# Patient Record
Sex: Female | Born: 1955 | Race: White | Hispanic: No | Marital: Married | Smoking: Current every day smoker
Health system: Southern US, Community
[De-identification: ages and names within clinical notes are randomized; demographics above are authoritative.]

## PROBLEM LIST (undated history)

## (undated) DIAGNOSIS — E119 Type 2 diabetes mellitus without complications: Secondary | ICD-10-CM

## (undated) DIAGNOSIS — Z8719 Personal history of other diseases of the digestive system: Secondary | ICD-10-CM

## (undated) DIAGNOSIS — M1990 Unspecified osteoarthritis, unspecified site: Secondary | ICD-10-CM

## (undated) DIAGNOSIS — K219 Gastro-esophageal reflux disease without esophagitis: Secondary | ICD-10-CM

## (undated) DIAGNOSIS — J449 Chronic obstructive pulmonary disease, unspecified: Secondary | ICD-10-CM

## (undated) DIAGNOSIS — C801 Malignant (primary) neoplasm, unspecified: Secondary | ICD-10-CM

## (undated) DIAGNOSIS — E785 Hyperlipidemia, unspecified: Secondary | ICD-10-CM

## (undated) HISTORY — PX: APPENDECTOMY: SHX54

## (undated) HISTORY — PX: TONSILLECTOMY: SUR1361

## (undated) HISTORY — PX: SKIN CANCER EXCISION: SHX779

## (undated) HISTORY — PX: ROTATOR CUFF REPAIR: SHX139

## (undated) HISTORY — PX: TRANSTHORACIC ECHOCARDIOGRAM: SHX275

---

## 1982-03-25 HISTORY — PX: ABDOMINAL HYSTERECTOMY: SHX81

## 2011-11-29 HISTORY — PX: CARDIOVASCULAR STRESS TEST: SHX262

## 2013-03-10 ENCOUNTER — Other Ambulatory Visit: Payer: Self-pay | Admitting: Orthopedic Surgery

## 2013-03-23 ENCOUNTER — Encounter (HOSPITAL_COMMUNITY): Payer: Self-pay | Admitting: Pharmacy Technician

## 2013-03-24 NOTE — Pre-Procedure Instructions (Signed)
Brendaly Townsel  03/24/2013   Your procedure is scheduled on:  Thursday, April 01, 2013 at 7:30 AM.   Report to [facility] Entrance "A" Admitting Office at 5:30 AM.   Call this number if you have problems the morning of surgery: [phone]   Remember:   Do not eat food or drink liquids after midnight Wednesday, 03/31/13.   Take these medicines the morning of surgery with A SIP OF WATER: Premarin, Symbicort inhaler   Do not wear jewelry, make-up or nail polish.  Do not wear lotions, powders, or perfumes. You may wear deodorant.  Do not shave 48 hours prior to surgery.   Do not bring valuables to the hospital.  [facility] is not responsible                  for any belongings or valuables.               Contacts, dentures or bridgework may not be worn into surgery.  Leave suitcase in the car. After surgery it may be brought to your room.  For patients admitted to the hospital, discharge time is determined by your                treatment team.          Special Instructions: Shower using CHG 2 nights before surgery and the night before surgery.  If you shower the day of surgery use CHG.  Use special wash - you have one bottle of CHG for all showers.  You should use approximately 1/3 of the bottle for each shower.   Please read over the following fact sheets that you were given: Pain Booklet, Coughing and Deep Breathing, Blood Transfusion Information and Surgical Site Infection Prevention

## 2013-03-26 ENCOUNTER — Encounter (HOSPITAL_COMMUNITY)
Admission: RE | Admit: 2013-03-26 | Discharge: 2013-03-26 | Disposition: A | Discharge status: Home / Self Care | Payer: Medicare Other | Source: Ambulatory Visit | Attending: Orthopedic Surgery | Admitting: Orthopedic Surgery

## 2013-03-26 ENCOUNTER — Encounter (HOSPITAL_COMMUNITY): Admission: RE | Admit: 2013-03-26 | Payer: Medicare Other | Source: Ambulatory Visit

## 2013-03-26 ENCOUNTER — Encounter (HOSPITAL_COMMUNITY): Payer: Self-pay

## 2013-03-26 DIAGNOSIS — Z01818 Encounter for other preprocedural examination: Secondary | ICD-10-CM | POA: Insufficient documentation

## 2013-03-26 DIAGNOSIS — Z01812 Encounter for preprocedural laboratory examination: Secondary | ICD-10-CM | POA: Insufficient documentation

## 2013-03-26 HISTORY — DX: Hyperlipidemia, unspecified: E78.5

## 2013-03-26 HISTORY — DX: Chronic obstructive pulmonary disease, unspecified: J44.9

## 2013-03-26 HISTORY — DX: Unspecified osteoarthritis, unspecified site: M19.90

## 2013-03-26 HISTORY — DX: Gastro-esophageal reflux disease without esophagitis: K21.9

## 2013-03-26 HISTORY — DX: Personal history of other diseases of the digestive system: Z87.19

## 2013-03-26 HISTORY — DX: Type 2 diabetes mellitus without complications: E11.9

## 2013-03-26 HISTORY — DX: Malignant (primary) neoplasm, unspecified: C80.1

## 2013-03-26 LAB — CBC WITH DIFFERENTIAL/PLATELET
BASOS ABS: 0 10*3/uL (ref 0.0–0.1)
BASOS PCT: 0 % (ref 0–1)
EOS PCT: 1 % (ref 0–5)
Eosinophils Absolute: 0.1 10*3/uL (ref 0.0–0.7)
HCT: 42.5 % (ref 36.0–46.0)
Hemoglobin: 14.3 g/dL (ref 12.0–15.0)
Lymphocytes Relative: 27 % (ref 12–46)
Lymphs Abs: 2.6 10*3/uL (ref 0.7–4.0)
MCH: 30.8 pg (ref 26.0–34.0)
MCHC: 33.6 g/dL (ref 30.0–36.0)
MCV: 91.4 fL (ref 78.0–100.0)
Monocytes Absolute: 0.9 10*3/uL (ref 0.1–1.0)
Monocytes Relative: 10 % (ref 3–12)
Neutro Abs: 6 10*3/uL (ref 1.7–7.7)
Neutrophils Relative %: 62 % (ref 43–77)
PLATELETS: 264 10*3/uL (ref 150–400)
RBC: 4.65 MIL/uL (ref 3.87–5.11)
RDW: 13.8 % (ref 11.5–15.5)
WBC: 9.7 10*3/uL (ref 4.0–10.5)

## 2013-03-26 LAB — COMPREHENSIVE METABOLIC PANEL
ALBUMIN: 4.2 g/dL (ref 3.5–5.2)
ALT: 13 U/L (ref 0–35)
AST: 20 U/L (ref 0–37)
Alkaline Phosphatase: 110 U/L (ref 39–117)
BUN: 8 mg/dL (ref 6–23)
CALCIUM: 9.3 mg/dL (ref 8.4–10.5)
CO2: 26 meq/L (ref 19–32)
Chloride: 100 mEq/L (ref 96–112)
Creatinine, Ser: 0.7 mg/dL (ref 0.50–1.10)
GFR calc Af Amer: >90 mL/min (ref >90)
GFR calc non Af Amer: >90 mL/min (ref >90)
Glucose, Bld: 113 mg/dL — ABNORMAL HIGH (ref 70–99)
Potassium: 4.5 mEq/L (ref 3.7–5.3)
SODIUM: 141 meq/L (ref 137–147)
Total Bilirubin: 0.5 mg/dL (ref 0.3–1.2)
Total Protein: 7.5 g/dL (ref 6.0–8.3)

## 2013-03-26 LAB — ABO/RH: ABO/RH(D): O POS

## 2013-03-26 LAB — TYPE AND SCREEN
ABO/RH(D): O POS
Antibody Screen: NEGATIVE

## 2013-03-26 LAB — URINALYSIS, ROUTINE W REFLEX MICROSCOPIC
Bilirubin Urine: NEGATIVE
Glucose, UA: NEGATIVE mg/dL
Hgb urine dipstick: NEGATIVE
KETONES UR: NEGATIVE mg/dL
Leukocytes, UA: NEGATIVE
NITRITE: NEGATIVE
PROTEIN: NEGATIVE mg/dL
Specific Gravity, Urine: 1.012 (ref 1.005–1.030)
UROBILINOGEN UA: 0.2 mg/dL (ref 0.0–1.0)
pH: 6 (ref 5.0–8.0)

## 2013-03-26 LAB — PROTIME-INR
INR: 0.97 (ref 0.00–1.49)
Prothrombin Time: 12.7 seconds (ref 11.6–15.2)

## 2013-03-26 LAB — APTT: aPTT: 32 seconds (ref 24–37)

## 2013-03-26 LAB — SEDIMENTATION RATE: Sed Rate: 10 mm/hr (ref 0–22)

## 2013-03-26 LAB — C-REACTIVE PROTEIN

## 2013-03-29 ENCOUNTER — Encounter (HOSPITAL_COMMUNITY): Payer: Self-pay

## 2013-03-31 MED ORDER — CEFAZOLIN SODIUM-DEXTROSE 2-3 GM-% IV SOLR
2.0000 g | INTRAVENOUS | Status: AC
  Administered 2013-04-01: 2 g via INTRAVENOUS
  Filled 2013-03-31: qty 50

## 2013-04-01 ENCOUNTER — Inpatient Hospital Stay (HOSPITAL_COMMUNITY): Payer: Medicare Other | Admitting: Anesthesiology

## 2013-04-01 ENCOUNTER — Encounter (HOSPITAL_COMMUNITY)
Admission: RE | Disposition: A | Discharge status: Home / Self Care | Payer: Self-pay | Source: Ambulatory Visit | Attending: Orthopedic Surgery

## 2013-04-01 ENCOUNTER — Inpatient Hospital Stay (HOSPITAL_COMMUNITY)
Admission: RE | Admit: 2013-04-01 | Discharge: 2013-04-02 | DRG: 483 | Disposition: A | Discharge status: Home / Self Care | Payer: Medicare Other | Source: Ambulatory Visit | Attending: Orthopedic Surgery | Admitting: Orthopedic Surgery

## 2013-04-01 ENCOUNTER — Inpatient Hospital Stay (HOSPITAL_COMMUNITY): Payer: Medicare Other

## 2013-04-01 ENCOUNTER — Encounter (HOSPITAL_COMMUNITY): Payer: Medicare Other | Admitting: Vascular Surgery

## 2013-04-01 ENCOUNTER — Encounter (HOSPITAL_COMMUNITY): Payer: Self-pay | Admitting: Surgery

## 2013-04-01 DIAGNOSIS — M19019 Primary osteoarthritis, unspecified shoulder: Secondary | ICD-10-CM | POA: Diagnosis present

## 2013-04-01 DIAGNOSIS — J449 Chronic obstructive pulmonary disease, unspecified: Secondary | ICD-10-CM | POA: Diagnosis present

## 2013-04-01 DIAGNOSIS — F17200 Nicotine dependence, unspecified, uncomplicated: Secondary | ICD-10-CM | POA: Diagnosis present

## 2013-04-01 DIAGNOSIS — E785 Hyperlipidemia, unspecified: Secondary | ICD-10-CM | POA: Diagnosis present

## 2013-04-01 DIAGNOSIS — K219 Gastro-esophageal reflux disease without esophagitis: Secondary | ICD-10-CM | POA: Diagnosis present

## 2013-04-01 DIAGNOSIS — Z85828 Personal history of other malignant neoplasm of skin: Secondary | ICD-10-CM

## 2013-04-01 DIAGNOSIS — E119 Type 2 diabetes mellitus without complications: Secondary | ICD-10-CM | POA: Diagnosis present

## 2013-04-01 DIAGNOSIS — Z79899 Other long term (current) drug therapy: Secondary | ICD-10-CM

## 2013-04-01 DIAGNOSIS — J4489 Other specified chronic obstructive pulmonary disease: Secondary | ICD-10-CM | POA: Diagnosis present

## 2013-04-01 HISTORY — PX: REVERSE SHOULDER ARTHROPLASTY: SHX5054

## 2013-04-01 LAB — GLUCOSE, CAPILLARY
GLUCOSE-CAPILLARY: 99 mg/dL (ref 70–99)
Glucose-Capillary: 94 mg/dL (ref 70–99)
Glucose-Capillary: 99 mg/dL (ref 70–99)
Glucose-Capillary: 91 mg/dL (ref 70–99)

## 2013-04-01 SURGERY — ARTHROPLASTY, SHOULDER, TOTAL, REVERSE
Anesthesia: Regional | Site: Shoulder | Laterality: Right

## 2013-04-01 MED ORDER — OXYCODONE HCL 5 MG PO TABS
5.0000 mg | ORAL_TABLET | ORAL | Status: DC | PRN
  Administered 2013-04-01: 5 mg via ORAL
  Administered 2013-04-01: 10 mg via ORAL
  Filled 2013-04-01: qty 1
  Filled 2013-04-01: qty 2

## 2013-04-01 MED ORDER — OXYCODONE-ACETAMINOPHEN 5-325 MG PO TABS: 1.0000 | ORAL_TABLET | ORAL | Status: DC | PRN

## 2013-04-01 MED ORDER — BUDESONIDE-FORMOTEROL FUMARATE 80-4.5 MCG/ACT IN AERO
2.0000 | INHALATION_SPRAY | Freq: Two times a day (BID) | RESPIRATORY_TRACT | Status: DC
  Administered 2013-04-01 – 2013-04-02 (×2): 2 via RESPIRATORY_TRACT
  Filled 2013-04-01: qty 6.9

## 2013-04-01 MED ORDER — ROSUVASTATIN CALCIUM 5 MG PO TABS
5.0000 mg | ORAL_TABLET | Freq: Every day | ORAL | Status: DC
  Filled 2013-04-01: qty 1

## 2013-04-01 MED ORDER — HYDROCODONE-ACETAMINOPHEN 7.5-325 MG/15ML PO SOLN
10.0000 mL | ORAL | Status: DC | PRN
  Administered 2013-04-01 – 2013-04-02 (×5): 10 mL via ORAL
  Filled 2013-04-01 (×5): qty 15

## 2013-04-01 MED ORDER — DIPHENHYDRAMINE HCL 12.5 MG/5ML PO ELIX: 12.5000 mg | ORAL_SOLUTION | ORAL | Status: DC | PRN

## 2013-04-01 MED ORDER — FENTANYL CITRATE 0.05 MG/ML IJ SOLN
INTRAMUSCULAR | Status: DC | PRN
  Administered 2013-04-01 (×2): 100 ug via INTRAVENOUS
  Administered 2013-04-01: 50 ug via INTRAVENOUS
  Administered 2013-04-01: 25 ug via INTRAVENOUS

## 2013-04-01 MED ORDER — PANTOPRAZOLE SODIUM 40 MG PO TBEC: 80.0000 mg | DELAYED_RELEASE_TABLET | Freq: Every day | ORAL | Status: DC

## 2013-04-01 MED ORDER — ONDANSETRON HCL 4 MG/2ML IJ SOLN
INTRAMUSCULAR | Status: DC | PRN
  Administered 2013-04-01: 4 mg via INTRAVENOUS

## 2013-04-01 MED ORDER — ONDANSETRON HCL 4 MG PO TABS: 4.0000 mg | ORAL_TABLET | Freq: Four times a day (QID) | ORAL | Status: DC | PRN

## 2013-04-01 MED ORDER — ACETAMINOPHEN 325 MG PO TABS: 650.0000 mg | ORAL_TABLET | Freq: Four times a day (QID) | ORAL | Status: DC | PRN

## 2013-04-01 MED ORDER — PROPOFOL 10 MG/ML IV BOLUS
INTRAVENOUS | Status: DC | PRN
  Administered 2013-04-01: 120 mg via INTRAVENOUS

## 2013-04-01 MED ORDER — ALBUTEROL SULFATE HFA 108 (90 BASE) MCG/ACT IN AERS: 2.0000 | INHALATION_SPRAY | Freq: Four times a day (QID) | RESPIRATORY_TRACT | Status: DC | PRN

## 2013-04-01 MED ORDER — ATORVASTATIN CALCIUM 10 MG PO TABS
10.0000 mg | ORAL_TABLET | Freq: Every day | ORAL | Status: DC
  Filled 2013-04-01: qty 1

## 2013-04-01 MED ORDER — CEFAZOLIN SODIUM-DEXTROSE 2-3 GM-% IV SOLR: 2.0000 g | INTRAVENOUS | Status: DC

## 2013-04-01 MED ORDER — ONDANSETRON HCL 4 MG/2ML IJ SOLN
4.0000 mg | Freq: Four times a day (QID) | INTRAMUSCULAR | Status: DC | PRN
  Administered 2013-04-01 – 2013-04-02 (×2): 4 mg via INTRAVENOUS
  Filled 2013-04-01 (×2): qty 2

## 2013-04-01 MED ORDER — HYDROMORPHONE HCL PF 1 MG/ML IJ SOLN
INTRAMUSCULAR | Status: AC
  Filled 2013-04-01: qty 1

## 2013-04-01 MED ORDER — SODIUM CHLORIDE 0.9 % IV SOLN
10.0000 mg | INTRAVENOUS | Status: DC | PRN
  Administered 2013-04-01: 10 ug/min via INTRAVENOUS

## 2013-04-01 MED ORDER — ASPIRIN EC 325 MG PO TBEC
325.0000 mg | DELAYED_RELEASE_TABLET | Freq: Two times a day (BID) | ORAL | Status: DC
  Administered 2013-04-01 – 2013-04-02 (×2): 325 mg via ORAL
  Filled 2013-04-01 (×5): qty 1

## 2013-04-01 MED ORDER — METOCLOPRAMIDE HCL 5 MG PO TABS
5.0000 mg | ORAL_TABLET | Freq: Three times a day (TID) | ORAL | Status: DC | PRN
Start: 2013-04-01 — End: 2013-04-02
  Filled 2013-04-01: qty 2

## 2013-04-01 MED ORDER — NEOSTIGMINE METHYLSULFATE 1 MG/ML IJ SOLN
INTRAMUSCULAR | Status: DC | PRN
  Administered 2013-04-01: 3 mg via INTRAVENOUS

## 2013-04-01 MED ORDER — LACTATED RINGERS IV SOLN
INTRAVENOUS | Status: DC | PRN
  Administered 2013-04-01 (×2): via INTRAVENOUS

## 2013-04-01 MED ORDER — ACETAMINOPHEN 325 MG PO TABS: 325.0000 mg | ORAL_TABLET | ORAL | Status: DC | PRN

## 2013-04-01 MED ORDER — ESTROGENS CONJUGATED 0.3 MG PO TABS
0.3000 mg | ORAL_TABLET | Freq: Every day | ORAL | Status: DC
  Administered 2013-04-01: 0.3 mg via ORAL
  Filled 2013-04-01 (×2): qty 1

## 2013-04-01 MED ORDER — ACETAMINOPHEN 160 MG/5ML PO SOLN
325.0000 mg | ORAL | Status: DC | PRN
  Filled 2013-04-01: qty 20.3

## 2013-04-01 MED ORDER — ALUMINUM HYDROXIDE GEL 320 MG/5ML PO SUSP
15.0000 mL | ORAL | Status: DC | PRN
  Filled 2013-04-01: qty 30

## 2013-04-01 MED ORDER — FLEET ENEMA 7-19 GM/118ML RE ENEM: 1.0000 | ENEMA | Freq: Once | RECTAL | Status: AC | PRN

## 2013-04-01 MED ORDER — ZOLPIDEM TARTRATE 5 MG PO TABS: 5.0000 mg | ORAL_TABLET | Freq: Every evening | ORAL | Status: DC | PRN

## 2013-04-01 MED ORDER — PHENOL 1.4 % MT LIQD: 1.0000 | OROMUCOSAL | Status: DC | PRN

## 2013-04-01 MED ORDER — BISACODYL 10 MG RE SUPP: 10.0000 mg | Freq: Every day | RECTAL | Status: DC | PRN

## 2013-04-01 MED ORDER — HEMOSTATIC AGENTS (NO CHARGE) OPTIME
TOPICAL | Status: DC | PRN
  Administered 2013-04-01: 1 via TOPICAL

## 2013-04-01 MED ORDER — DOCUSATE SODIUM 100 MG PO CAPS
100.0000 mg | ORAL_CAPSULE | Freq: Two times a day (BID) | ORAL | Status: DC
  Administered 2013-04-01 – 2013-04-02 (×2): 100 mg via ORAL
  Filled 2013-04-01 (×3): qty 1

## 2013-04-01 MED ORDER — POVIDONE-IODINE 7.5 % EX SOLN
Freq: Once | CUTANEOUS | Status: DC
  Filled 2013-04-01: qty 118

## 2013-04-01 MED ORDER — ALBUTEROL SULFATE (2.5 MG/3ML) 0.083% IN NEBU: 2.5000 mg | INHALATION_SOLUTION | Freq: Four times a day (QID) | RESPIRATORY_TRACT | Status: DC | PRN

## 2013-04-01 MED ORDER — ROCURONIUM BROMIDE 100 MG/10ML IV SOLN
INTRAVENOUS | Status: DC | PRN
  Administered 2013-04-01: 10 mg via INTRAVENOUS
  Administered 2013-04-01: 30 mg via INTRAVENOUS

## 2013-04-01 MED ORDER — HYDROCODONE-ACETAMINOPHEN 5-325 MG PO TABS: 1.0000 | ORAL_TABLET | ORAL | Status: DC | PRN

## 2013-04-01 MED ORDER — MENTHOL 3 MG MT LOZG
1.0000 | LOZENGE | OROMUCOSAL | Status: DC | PRN
Start: 2013-04-01 — End: 2013-04-02

## 2013-04-01 MED ORDER — MORPHINE SULFATE 2 MG/ML IJ SOLN
1.0000 mg | INTRAMUSCULAR | Status: DC | PRN
  Administered 2013-04-01 (×2): 1 mg via INTRAVENOUS
  Filled 2013-04-01 (×2): qty 1

## 2013-04-01 MED ORDER — GLYCOPYRROLATE 0.2 MG/ML IJ SOLN
INTRAMUSCULAR | Status: DC | PRN
  Administered 2013-04-01: .5 mg via INTRAVENOUS

## 2013-04-01 MED ORDER — ACETAMINOPHEN 650 MG RE SUPP: 650.0000 mg | Freq: Four times a day (QID) | RECTAL | Status: DC | PRN

## 2013-04-01 MED ORDER — MIDAZOLAM HCL 5 MG/5ML IJ SOLN
INTRAMUSCULAR | Status: DC | PRN
  Administered 2013-04-01 (×2): 1 mg via INTRAVENOUS

## 2013-04-01 MED ORDER — CEFAZOLIN SODIUM 1-5 GM-% IV SOLN
1.0000 g | Freq: Four times a day (QID) | INTRAVENOUS | Status: AC
  Administered 2013-04-01 – 2013-04-02 (×2): 1 g via INTRAVENOUS
  Filled 2013-04-01 (×3): qty 50

## 2013-04-01 MED ORDER — SODIUM CHLORIDE 0.9 % IV SOLN
INTRAVENOUS | Status: DC
  Administered 2013-04-02: 01:00:00 via INTRAVENOUS

## 2013-04-01 MED ORDER — DOCUSATE SODIUM 100 MG PO CAPS: 100.0000 mg | ORAL_CAPSULE | Freq: Three times a day (TID) | ORAL | Status: DC | PRN

## 2013-04-01 MED ORDER — SENNOSIDES-DOCUSATE SODIUM 8.6-50 MG PO TABS
1.0000 | ORAL_TABLET | Freq: Every evening | ORAL | Status: DC | PRN
Start: 2013-04-01 — End: 2013-04-02

## 2013-04-01 MED ORDER — LIDOCAINE HCL (CARDIAC) 20 MG/ML IV SOLN
INTRAVENOUS | Status: DC | PRN
  Administered 2013-04-01: 60 mg via INTRAVENOUS

## 2013-04-01 MED ORDER — ONDANSETRON HCL 4 MG/2ML IJ SOLN: 4.0000 mg | Freq: Once | INTRAMUSCULAR | Status: DC | PRN

## 2013-04-01 MED ORDER — SODIUM CHLORIDE 0.9 % IR SOLN
Status: DC | PRN
  Administered 2013-04-01: 1000 mL

## 2013-04-01 MED ORDER — METOCLOPRAMIDE HCL 5 MG/ML IJ SOLN
5.0000 mg | Freq: Three times a day (TID) | INTRAMUSCULAR | Status: DC | PRN
Start: 2013-04-01 — End: 2013-04-02

## 2013-04-01 MED ORDER — HYDROMORPHONE HCL PF 1 MG/ML IJ SOLN
0.2500 mg | INTRAMUSCULAR | Status: DC | PRN
  Administered 2013-04-01 (×2): 0.5 mg via INTRAVENOUS

## 2013-04-01 MED ORDER — ROPIVACAINE HCL 5 MG/ML IJ SOLN
INTRAMUSCULAR | Status: DC | PRN
  Administered 2013-04-01: 20 mL via PERINEURAL

## 2013-04-01 SURGICAL SUPPLY — 84 items
BIT DRILL 5/64X5 DISP (BIT) IMPLANT
BLADE SAW SAG 73X25 THK (BLADE) ×1
BLADE SAW SGTL 73X25 THK (BLADE) ×2 IMPLANT
BLADE SURG 15 STRL LF DISP TIS (BLADE) ×2 IMPLANT
BLADE SURG 15 STRL SS (BLADE) ×1
BOWL SMART MIX CTS (DISPOSABLE) IMPLANT
CAPT SHOULD DELTAXTEND CEM MOD ×3 IMPLANT
CEMENT BONE DEPUY (Cement) ×3 IMPLANT
CHLORAPREP W/TINT 26ML (MISCELLANEOUS) ×3 IMPLANT
CLOTH BEACON ORANGE TIMEOUT ST (SAFETY) IMPLANT
COVER SURGICAL LIGHT HANDLE (MISCELLANEOUS) ×3 IMPLANT
DRAPE INCISE IOBAN 66X45 STRL (DRAPES) ×9 IMPLANT
DRAPE SURG 17X23 STRL (DRAPES) ×3 IMPLANT
DRAPE TABLE COVER HEAVY DUTY (DRAPES) ×3 IMPLANT
DRAPE U-SHAPE 47X51 STRL (DRAPES) ×3 IMPLANT
DRSG ADAPTIC 3X8 NADH LF (GAUZE/BANDAGES/DRESSINGS) IMPLANT
DRSG MEPILEX BORDER 4X4 (GAUZE/BANDAGES/DRESSINGS) ×3 IMPLANT
DRSG MEPILEX BORDER 4X8 (GAUZE/BANDAGES/DRESSINGS) ×3 IMPLANT
DRSG PAD ABDOMINAL 8X10 ST (GAUZE/BANDAGES/DRESSINGS) IMPLANT
ELECT BLADE 4.0 EZ CLEAN MEGAD (MISCELLANEOUS) ×3
ELECT REM PT RETURN 9FT ADLT (ELECTROSURGICAL) ×3
ELECTRODE BLDE 4.0 EZ CLN MEGD (MISCELLANEOUS) ×2 IMPLANT
ELECTRODE REM PT RTRN 9FT ADLT (ELECTROSURGICAL) ×2 IMPLANT
EVACUATOR 1/8 PVC DRAIN (DRAIN) ×3 IMPLANT
GLOVE BIO SURGEON STRL SZ7 (GLOVE) ×3 IMPLANT
GLOVE BIO SURGEON STRL SZ7.5 (GLOVE) ×3 IMPLANT
GLOVE BIOGEL PI IND STRL 6.5 (GLOVE) ×2 IMPLANT
GLOVE BIOGEL PI IND STRL 7.0 (GLOVE) ×8 IMPLANT
GLOVE BIOGEL PI IND STRL 7.5 (GLOVE) ×2 IMPLANT
GLOVE BIOGEL PI IND STRL 8 (GLOVE) ×2 IMPLANT
GLOVE BIOGEL PI INDICATOR 6.5 (GLOVE) ×1
GLOVE BIOGEL PI INDICATOR 7.0 (GLOVE) ×4
GLOVE BIOGEL PI INDICATOR 7.5 (GLOVE) ×1
GLOVE BIOGEL PI INDICATOR 8 (GLOVE) ×1
GLOVE ECLIPSE 6.5 STRL STRAW (GLOVE) ×6 IMPLANT
GLOVE SURG ORTHO 7.0 STRL STRW (GLOVE) ×3 IMPLANT
GLOVE SURG SS PI 6.5 STRL IVOR (GLOVE) ×3 IMPLANT
GOWN PREVENTION PLUS LG XLONG (DISPOSABLE) IMPLANT
GOWN STRL REIN XL XLG (GOWN DISPOSABLE) IMPLANT
GOWN STRL REUS W/ TWL LRG LVL3 (GOWN DISPOSABLE) ×8 IMPLANT
GOWN STRL REUS W/ TWL XL LVL3 (GOWN DISPOSABLE) ×2 IMPLANT
GOWN STRL REUS W/TWL LRG LVL3 (GOWN DISPOSABLE) ×4
GOWN STRL REUS W/TWL XL LVL3 (GOWN DISPOSABLE) ×1
HANDPIECE INTERPULSE COAX TIP (DISPOSABLE) ×1
HEMOSTAT SURGICEL 2X14 (HEMOSTASIS) ×3 IMPLANT
HOOD PEEL AWAY FACE SHEILD DIS (HOOD) ×9 IMPLANT
KIT BASIN OR (CUSTOM PROCEDURE TRAY) ×3 IMPLANT
KIT ROOM TURNOVER OR (KITS) ×3 IMPLANT
MANIFOLD NEPTUNE II (INSTRUMENTS) ×3 IMPLANT
NEEDLE HYPO 25GX1X1/2 BEV (NEEDLE) IMPLANT
NEEDLE MAYO TROCAR (NEEDLE) ×3 IMPLANT
NS IRRIG 1000ML POUR BTL (IV SOLUTION) ×3 IMPLANT
PACK SHOULDER (CUSTOM PROCEDURE TRAY) ×3 IMPLANT
PAD ARMBOARD 7.5X6 YLW CONV (MISCELLANEOUS) ×6 IMPLANT
PIN METAGLENE 2.5 (PIN) ×3 IMPLANT
RETRIEVER SUT HEWSON (MISCELLANEOUS) ×3 IMPLANT
SET HNDPC FAN SPRY TIP SCT (DISPOSABLE) ×2 IMPLANT
SLING ARM IMMOBILIZER LRG (SOFTGOODS) IMPLANT
SLING ARM IMMOBILIZER MED (SOFTGOODS) ×3 IMPLANT
SMARTMIX MINI TOWER (MISCELLANEOUS) ×3
SPONGE GAUZE 4X4 12PLY (GAUZE/BANDAGES/DRESSINGS) IMPLANT
SPONGE LAP 18X18 X RAY DECT (DISPOSABLE) ×3 IMPLANT
SPONGE LAP 4X18 X RAY DECT (DISPOSABLE) ×3 IMPLANT
STRIP CLOSURE SKIN 1/2X4 (GAUZE/BANDAGES/DRESSINGS) ×3 IMPLANT
SUCTION FRAZIER TIP 10 FR DISP (SUCTIONS) ×3 IMPLANT
SUPPORT WRAP ARM LG (MISCELLANEOUS) ×3 IMPLANT
SUT ETHIBOND 2 OS 4 DA (SUTURE) IMPLANT
SUT ETHIBOND NAB CT1 #1 30IN (SUTURE) ×3 IMPLANT
SUT FIBERWIRE #2 38 T-5 BLUE (SUTURE) ×9
SUT MNCRL AB 4-0 PS2 18 (SUTURE) ×3 IMPLANT
SUT SILK 2 0 TIES 17X18 (SUTURE)
SUT SILK 2-0 18XBRD TIE BLK (SUTURE) IMPLANT
SUT VIC AB 0 CTB1 27 (SUTURE) ×3 IMPLANT
SUT VIC AB 2-0 CT1 27 (SUTURE) ×1
SUT VIC AB 2-0 CT1 TAPERPNT 27 (SUTURE) ×2 IMPLANT
SUTURE FIBERWR #2 38 T-5 BLUE (SUTURE) ×6 IMPLANT
SWAB COLLECTION DEVICE MRSA (MISCELLANEOUS) ×3 IMPLANT
SYR CONTROL 10ML LL (SYRINGE) IMPLANT
TOWEL OR 17X24 6PK STRL BLUE (TOWEL DISPOSABLE) ×6 IMPLANT
TOWEL OR 17X26 10 PK STRL BLUE (TOWEL DISPOSABLE) ×3 IMPLANT
TOWER SMARTMIX MINI (MISCELLANEOUS) ×2 IMPLANT
TRAY FOLEY CATH 16FRSI W/METER (SET/KITS/TRAYS/PACK) IMPLANT
TUBE ANAEROBIC SPECIMEN COL (MISCELLANEOUS) ×3 IMPLANT
WATER STERILE IRR 1000ML POUR (IV SOLUTION) IMPLANT

## 2013-04-01 NOTE — Discharge Instructions (Signed)
Discharge Instructions after Reverse Total Shoulder Arthroplasty ° ° °A sling has been provided for you. You are to where this at all times, even while sleeping, until your first post operative visit with Dr. Chandler. °Use ice on the shoulder intermittently over the first 48 hours after surgery.  °Pain medicine has been prescribed for you.  °Use your medicine liberally over the first 48 hours, and then you can begin to taper your use. You may take Extra Strength Tylenol or Tylenol only in place of the pain pills. DO NOT take ANY nonsteroidal anti-inflammatory pain medications: Advil, Motrin, Ibuprofen, Aleve, Naproxen or Naprosyn.  °Take one aspirin a day for 2 weeks after surgery, unless you have an aspirin sensitivity/allergy or asthma.  °You may remove your dressing after two days  °You may shower 5 days after surgery. The incisions CANNOT get wet prior to 5 days. Simply allow the water to wash over the site and then pat dry. Do not rub the incisions. Make sure your axilla (armpit) is completely dry after showering. ° ° ° °Please call [phone] during normal business hours or [phone] after hours for any problems. Including the following: ° °- excessive redness of the incisions °- drainage for more than 4 days °- fever of more than 101.5 F ° °*Please note that pain medications will not be refilled after hours or on weekends. ° ° ° ° °

## 2013-04-01 NOTE — H&P (Signed)
Michelle Perez is an 59 y.o. female.   Chief Complaint: Chronic right shoulder HPI: Chronic severe right shoulder pain that limits her daily activities and her sleep. She has had multiple previous surgeries and is left with painful dysfunctional shoulder. She has failed other treatments with activity modification, injection treatment, physical therapy and oral medications.  Past Medical History  Diagnosis Date  . Hyperlipemia   . Diabetes mellitus without complication     type 2 , diet and exercise controlled  . COPD (chronic obstructive pulmonary disease)   . GERD (gastroesophageal reflux disease)   . H/O hiatal hernia   . Arthritis   . Cancer     skin cancer - nose    Past Surgical History  Procedure Laterality Date  . Skin cancer excision      nose and back  . Abdominal hysterectomy  1984  . Tonsillectomy      and adenoids age 32  . Rotator cuff repair Right     x3  . Cardiovascular stress test  11/29/11    poor image quality, no evidence of ischemia in well visualized segments, inferior lateral wall impossible to evaluate secondary to significant GI artifiact, preserved EF 56% [facility])    History reviewed. No pertinent family history. Social History:  reports that she has been smoking.  She does not have any smokeless tobacco history on file. She reports that she does not drink alcohol or use illicit drugs.  Allergies:  Allergies  Allergen Reactions  . Codeine Hives    vicodin is okay  . Tape Rash    Medications Prior to Admission  Medication Sig Dispense Refill  . budesonide-formoterol (SYMBICORT) 80-4.5 MCG/ACT inhaler Inhale 2 puffs into the lungs 2 (two) times daily.      Marland Kitchen esomeprazole (NEXIUM) 40 MG capsule Take 40 mg by mouth daily at 12 noon.      . estrogens, conjugated, (PREMARIN) 0.3 MG tablet Take 0.3 mg by mouth daily. Take daily for 21 days then do not take for 7 days.      . rosuvastatin (CRESTOR) 5 MG tablet Take 5 mg by mouth daily.      Marland Kitchen  albuterol (PROAIR HFA) 108 (90 BASE) MCG/ACT inhaler Inhale into the lungs every 6 (six) hours as needed for wheezing or shortness of breath.        No results found for this or any previous visit (from the past 48 hour(s)). No results found.  Review of Systems  All other systems reviewed and are negative.    Blood pressure 126/56, pulse 74, temperature 97.5 F (36.4 C), temperature source Oral, resp. rate 18, SpO2 96.00%. Physical Exam  Constitutional: She is oriented to person, place, and time. She appears well-developed and well-nourished.  HENT:  Head: Atraumatic.  Eyes: EOM are normal.  Cardiovascular: Intact distal pulses.   Respiratory: Effort normal.  Musculoskeletal:  R shoulder pain with any motion.  Motion limited to 100 deg FF.  Grossly NVID.  Neurological: She is alert and oriented to person, place, and time.  Skin: Skin is warm and dry.  Psychiatric: She has a normal mood and affect.     Assessment/Plan Chronic right shoulder pain status post multiple previous surgeries Plan total shoulder versus reverse total shoulder replacement depending on status rotator cuff Risks / benefits of surgery discussed Consent on chart  NPO for OR Preop antibiotics   Chantell Kunkler WILLIAM 04/01/2013, 7:18 AM

## 2013-04-01 NOTE — Progress Notes (Signed)
During preop assessment patient informed Nurse that she was having a total shoulder replacement on her right shoulder, however the most recent consent form in EPIC stated that patient was having a right shoulder arthroscopy rotator cuff repair. Nurse called Dr. Tamera Punt for clarification on planned procedure and he stated that patient is having a right total shoulder replacement today. Will reenter consent form, inform patient of this, and have her sign consent form.

## 2013-04-01 NOTE — Progress Notes (Signed)
Report given to angel rn as cargiver

## 2013-04-01 NOTE — Progress Notes (Signed)
Utilization review completed.  

## 2013-04-01 NOTE — Anesthesia Procedure Notes (Addendum)
Anesthesia Regional Block:  Interscalene brachial plexus block  Pre-Anesthetic Checklist: ,, timeout performed, Correct Patient, Correct Site, Correct Laterality, Correct Procedure, Correct Position, site marked, Risks and benefits discussed,  Surgical consent,  Pre-op evaluation,  At surgeon's request and post-op pain management  Laterality: Right  Prep: chloraprep       Needles:  Injection technique: Single-shot  Needle Type: Stimiplex          Additional Needles:  Procedures: ultrasound guided (picture in chart) Interscalene brachial plexus block Narrative:  Start time: 04/01/2013 7:15 AM End time: 04/01/2013 7:22 AM Injection made incrementally with aspirations every 5 mL.  Performed by: Personally  Anesthesiologist: [place]:   Narrative:    Procedure Name: Intubation Date/Time: 04/01/2013 8:52 AM Performed by: Octavio Graves Pre-anesthesia Checklist: Patient identified, Timeout performed, Emergency Drugs available, Patient being monitored and Suction available Patient Re-evaluated:Patient Re-evaluated prior to inductionOxygen Delivery Method: Circle system utilized Preoxygenation: Pre-oxygenation with 100% oxygen Intubation Type: IV induction Ventilation: Mask ventilation without difficulty Laryngoscope Size: Miller and 2 Grade View: Grade I Tube type: Oral Tube size: 7.0 mm Number of attempts: 1 Airway Equipment and Method: Stylet Placement Confirmation: ETT inserted through vocal cords under direct vision,  breath sounds checked- equal and bilateral and positive ETCO2 Secured at: 22 cm Tube secured with: Tape Dental Injury: Teeth and Oropharynx as per pre-operative assessment  Comments: IV induction Moser- intubation AM CRNA- atraumatic teeth and mouth as preop

## 2013-04-01 NOTE — Transfer of Care (Signed)
Immediate Anesthesia Transfer of Care Note  Patient: Michelle Perez  Procedure(s) Performed: Procedure(s): REVERSE SHOULDER ARTHROPLASTY (Right)  Patient Location: PACU  Anesthesia Type:General  Level of Consciousness: alert   Airway & Oxygen Therapy: Patient Spontanous Breathing and Patient connected to nasal cannula oxygen  Post-op Assessment: Report given to PACU RN and Post -op Vital signs reviewed and stable  Post vital signs: Reviewed and stable  Complications: No apparent anesthesia complications

## 2013-04-01 NOTE — Anesthesia Preprocedure Evaluation (Addendum)
Anesthesia Evaluation  Patient identified by MRN, date of birth, ID band Patient awake    Reviewed: Allergy & Precautions, H&P , NPO status , Patient's Chart, lab work & pertinent test results  History of Anesthesia Complications Negative for: history of anesthetic complications  Airway Mallampati: I TM Distance: >3 FB Neck ROM: Full    Dental  (+) Upper Dentures, Teeth Intact and Dental Advisory Given   Pulmonary COPD COPD inhaler, Current Smoker,  Smokes 1/2 ppd   Pulmonary exam normal       Cardiovascular Exercise Tolerance: Good negative cardio ROS  Rhythm:Regular Rate:Normal - Systolic murmurs and - Diastolic murmurs    Neuro/Psych negative neurological ROS  negative psych ROS   GI/Hepatic Neg liver ROS, hiatal hernia, GERD-  Medicated and Controlled,  Endo/Other  diabetes, Well Controlled, Type 2  Renal/GU negative Renal ROS  negative genitourinary   Musculoskeletal   Abdominal   Peds  Hematology negative hematology ROS (+)   Anesthesia Other Findings   Reproductive/Obstetrics                          Anesthesia Physical Anesthesia Plan  ASA: II  Anesthesia Plan: General and Regional   Post-op Pain Management:    Induction: Intravenous  Airway Management Planned: Oral ETT and LMA  Additional Equipment: None  Intra-op Plan:   Post-operative Plan: Extubation in OR  Informed Consent: I have reviewed the patients History and Physical, chart, labs and discussed the procedure including the risks, benefits and alternatives for the proposed anesthesia with the patient or authorized representative who has indicated his/her understanding and acceptance.   Dental advisory given  Plan Discussed with: CRNA and Surgeon  Anesthesia Plan Comments:         Anesthesia Quick Evaluation

## 2013-04-01 NOTE — Op Note (Signed)
Procedure(s): REVERSE SHOULDER ARTHROPLASTY Procedure Note  Steward DroneWinona Perez female 58 y.o. 04/01/2013  Procedure(s) and Anesthesia Type:    *RIGHT REVERSE SHOULDER ARTHROPLASTY - General with preoperative interscalene block   Indications:  58 y.o. female  With chronic severe right shoulder pain status post multiple previous right shoulder surgeries. She failed conservative treatment with extensive activity modification, oral medications, injections and physical therapy. Ultimately she want to go forward shoulder replacement in hopes of decreasing her shoulder pain. I felt that given her multiple previous rotator cuff surgeries and abnormal anatomy that a reverse total shoulder replacement would be the most reliable method of relieving her pain. She understood potential risks benefits alternatives to the procedure including but not limited to risk of bleeding infection damage to neurovascular structures, incomplete pain relief, and the potential need for future surgery. She was to go forward with surgery.      Surgeon: Mable ParisHANDLER,Helen Cuff WILLIAM   Assistants: Damita Lackanielle Lalibert PA-C [facility](Danielle was present and scrubbed throughout the procedure and was essential in positioning, retraction, exposure, and closure)  Anesthesia: General endotracheal anesthesia with preoperative interscalene block    Procedure Detail  REVERSE SHOULDER ARTHROPLASTY   Estimated Blood Loss:  150 mL         Drains: 1 medium hemovac  Blood Given: none          Specimens: none        Complications:  * No complications entered in OR log *         Disposition: PACU - hemodynamically stable.         Condition: stable      OPERATIVE FINDINGS:  A DePuy press-fit reverse total shoulder arthroplasty was placed with a  size 10 stem, 1 centered, a 38 glenosphere standard, and a +3-mm poly insert. The base plate  fixation was excellent.  PROCEDURE: The patient was identified in the preoperative holding area  where I  personally marked the operative site after verifying site, side,  and procedure with the patient. An interscalene block given by  the attending anesthesiologist in the holding area and the patient was taken back to the operating room where all extremities were  carefully padded in position after general anesthesia was induced. She  was placed in a beach-chair position and the operative upper extremity was  prepped and draped in a standard sterile fashion. An approximately 10-  cm incision was made from the tip of the coracoid process to the center  point of the humerus at the level of the axilla. Dissection was carried  down through subcutaneous tissues to the level of the cephalic vein  which was taken laterally with the deltoid. The pectoralis major was  retracted medially. The subdeltoid space was developed and the lateral  edge of the conjoined tendon was identified. The undersurface of  conjoined tendon was palpated and the musculocutaneous nerve was not in  the field. Retractor was placed underneath the conjoined and second  retractor was placed lateral into the deltoid. The circumflex humeral  artery and vessels were identified and clamped and coagulated. The  biceps tendon was scarred into the proximal humerus and was.  The subscapularis was taken down in a sheath with.  The  joint was then gently externally rotated while the capsule was released  from the humeral neck around to just beyond the 6 o'clock position. At  this point, the joint was dislocated and the humeral head was presented  into the wound. Rotator cuff was noted to be intact  with no complete tears however at the remaining cuff was very thin and appeared to be of poor quality. The posterior head was deficient. There is no obvious defect on the articular surface. Given the abnormal appearing rotator cuff and atrophy seen in MRI I felt that it could not be reliable for a total shoulder arthroplasty would likely cause  continued pain. Therefore the decision was made to proceed with a reverse total shoulder arthroplasty. The intramedullary canal was then entered with  sequential reamers up to a 10-mm reamer which was felt to be the  appropriate size. The cutting guide was used to make the appropriate  head cut and the head was saved potentially bone grafting.  The glenoid was exposed with the arm in an  abducted extended position. The anterior and posterior labrum were  completely excised and the capsule was released circumferentially to  allow for exposure of the glenoid for preparation. During exposure I was attempting to digitally palpate the axillary nerve on the lower border of the subscapularis and was manually freeing up some scar tissue I encountered some brisk bleeding. I was able to get a retractor beneath the conjoined tendon and visualize a small venous branch that was bleeding briskly. Under direct visualization I used a tonsil to clamp it while Bovie electric cautery was used to coagulate. There was close proximity to neurovascular structures and the arm did show some feedback with electrocautery, however this was done under direct visualization and the clamp was not touching any other structures other than the bleeding vein. The bleeding was successfully stopped.  The guidepin was then placed using the guide in neutral angulation and the reamer was  placed over the guidepin to ream down to concentric surface. Superior  hand reamer was used as well. The central drill hole was then made and  stayed within the glenoid vault. The Metaglene was then impacted with  Excellent press fit. The superior and inferior screws were then  drilled, measured, and filled with appropriate-sized locking screw  alternatively tightening top and bottom to bring the Metaglene down  tightly against the bone. A posterior nonlocking screw was placed, but she did not have enough bone to place a reasonable anterior screw and this  hole was left open.. Fixation was very good.  The humerus was then again exposed and the proximal metaphyseal reamer was used for a size 1 implant. Several suture anchors were removed. The gleno sphere was placed in the metaphysis of the proximal humerus and then reduced to the base plate.  The glenosphere was then impacted over the [facility] taper and tightened down  with the screw. The trial implant was then again placed in the proximal humerus and the poly trials were tested and the above implant was felt to be the most appropriate soft tissue tensioning with excellent stability and  excellent range of motion. Therefore, final humeral stem was placed press fit.  And then the trial polyethylene inserts were tested again and the above implant was felt to be the most appropriate for final insertion. The joint was reduced taken through full range of motion and felt to be stable. Soft tissue tension was appropriate. A medium Hemovac drain was placed out underneath the deltoid prior to closure. The joint was then copiously irrigated with pulse  lavage and the wound was then closed. The subscapularis was repaired using 2 #2 FiberWire soft in a Mason-Allen configuration.  Skin was closed with 2-0 Vicryl in a deep dermal layer and 4-0  Monocryl for skin closure. Steri-Strips were applied.terile  dressings were then applied as well as a sling. The patient was allowed  to awaken from general anesthesia, transferred to stretcher, and taken  to recovery room in stable condition.   POSTOPERATIVE PLAN: The patient will be kept in the hospital postoperatively  for pain control and therapy.

## 2013-04-01 NOTE — Preoperative (Signed)
Beta Blockers   Reason not to administer Beta Blockers:Not Applicable 

## 2013-04-02 LAB — BASIC METABOLIC PANEL
BUN: 6 mg/dL (ref 6–23)
CO2: 27 mEq/L (ref 19–32)
CREATININE: 0.63 mg/dL (ref 0.50–1.10)
Calcium: 8.2 mg/dL — ABNORMAL LOW (ref 8.4–10.5)
Chloride: 100 mEq/L (ref 96–112)
Glucose, Bld: 129 mg/dL — ABNORMAL HIGH (ref 70–99)
Potassium: 4 mEq/L (ref 3.7–5.3)
Sodium: 137 mEq/L (ref 137–147)

## 2013-04-02 LAB — CBC
HEMATOCRIT: 31.2 % — AB (ref 36.0–46.0)
Hemoglobin: 10.7 g/dL — ABNORMAL LOW (ref 12.0–15.0)
MCH: 31.5 pg (ref 26.0–34.0)
MCHC: 34.3 g/dL (ref 30.0–36.0)
MCV: 91.8 fL (ref 78.0–100.0)
PLATELETS: 186 10*3/uL (ref 150–400)
RBC: 3.4 MIL/uL — ABNORMAL LOW (ref 3.87–5.11)
RDW: 13.9 % (ref 11.5–15.5)
WBC: 8.8 10*3/uL (ref 4.0–10.5)

## 2013-04-02 LAB — GLUCOSE, CAPILLARY
Glucose-Capillary: 124 mg/dL — ABNORMAL HIGH (ref 70–99)
Glucose-Capillary: 117 mg/dL — ABNORMAL HIGH (ref 70–99)

## 2013-04-02 NOTE — Anesthesia Postprocedure Evaluation (Signed)
  Anesthesia Post-op Note  Patient: Michelle Perez  Procedure(s) Performed: Procedure(s): REVERSE SHOULDER ARTHROPLASTY (Right)  Patient Location: PACU  Anesthesia Type:General and Regional  Level of Consciousness: awake, alert  and oriented  Airway and Oxygen Therapy: Patient Spontanous Breathing  Post-op Pain: none  Post-op Assessment: Post-op Vital signs reviewed, Patient's Cardiovascular Status Stable, Respiratory Function Stable, Patent Airway and No signs of Nausea or vomiting  Post-op Vital Signs: Reviewed and stable  Complications: No apparent anesthesia complications

## 2013-04-02 NOTE — Evaluation (Signed)
Occupational Therapy Evaluation and Discharge Patient Details Name: Michelle Perez MRN: [id number] DOB: [birth year] Today's Date: 04/02/2013 Time: 8182-9937 OT Time Calculation (min): 24 min  OT Assessment / Plan / Recommendation History of present illness reverse RTSA   Clinical Impression   This 58 yo female admitted and underwent above presents to acute OT with all education completed. Will D/C from acute OT.    OT Assessment  Progress rehab of shoulder as ordered by MD at follow-up appointment    Follow Up Recommendations   (follow up per MD)       Equipment Recommendations  None recommended by OT          Precautions / Restrictions Precautions Precautions: Shoulder Type of Shoulder Precautions: Post op shoulder handout given Shoulder Interventions: Shoulder sling/immobilizer;Off for dressing/bathing/exercises Required Braces or Orthoses: Sling Restrictions Weight Bearing Restrictions: Yes Other Position/Activity Restrictions: NWB        Acute Rehab OT Goals Patient Stated Goal: Home today  Visit Information  Last OT Received On: 04/02/13 Assistance Needed: +1 History of Present Illness: reverse RTSA       Prior Functioning     Home Living Family/patient expects to be discharged to:: Private residence Living Arrangements: Spouse/significant other Available Help at Discharge: Available 24 hours/day;Family Type of Home: House Home Equipment: Hand held shower head Prior Function Level of Independence: Independent Communication Communication: No difficulties Dominant Hand: Right            Cognition  Cognition Arousal/Alertness: Awake/alert Behavior During Therapy: WFL for tasks assessed/performed Overall Cognitive Status: Within Functional Limits for tasks assessed    Extremity/Trunk Assessment Upper Extremity Assessment Upper Extremity Assessment: RUE deficits/detail RUE Deficits / Details: Reverse TSA this admission; elbow is minimally limited  in full extension; wrist, hand WNL RUE Coordination: decreased gross motor     Mobility Bed Mobility Overal bed mobility: Modified Independent Transfers Overall transfer level: Needs assistance Equipment used: 1 person hand held assist Transfers: Sit to/from Stand Sit to Stand: Min guard     Exercise Other Exercises Other Exercises: Pt aware she is to do elbow exercises 5x/day 10 reps each (can do more if she wishes) Donning/doffing shirt without moving shoulder: Caregiver independent with task Method for sponge bathing under operated UE: Caregiver independent with task Donning/doffing sling/immobilizer: Caregiver independent with task Correct positioning of sling/immobilizer: Caregiver independent with task Pendulum exercises (written home exercise program):  (NA) ROM for elbow, wrist and digits of operated UE: Independent Sling wearing schedule (on at all times/off for ADL's): Independent Proper positioning of operated UE when showering: Independent Dressing change:  (NA) Positioning of UE while sleeping: Caregiver independent with task      End of Session OT - End of Session Activity Tolerance:  (limited by nausea when up) Patient left: in bed;with call bell/phone within reach;with family/visitor present;with nursing/sitter in room Nurse Communication:  (pt needs nausea meds)       Almon Register [phone] 04/02/2013, 11:17 AM

## 2013-04-02 NOTE — Progress Notes (Signed)
PATIENT ID: Michelle Perez   1 Day Post-Op Procedure(s) (LRB): REVERSE SHOULDER ARTHROPLASTY (Right)  Subjective: Reports difficulty with pain overnight after block wore off. Pain better controlled now this am. No other complaints or concerns.   Objective:  Filed Vitals:   04/02/13 0544  BP: 132/51  Pulse: 82  Temp: 99.9 F (37.7 C)  Resp: 18     R UE dressing c/d/i Drain removed today  Sling repositioned Wiggles fingers, distally NVI   Labs:   Recent Labs  04/02/13 0550  HGB 10.7*   Recent Labs  04/02/13 0550  WBC 8.8  RBC 3.40*  HCT 31.2*  PLT 186   Recent Labs  04/02/13 0550  NA 137  K 4.0  CL 100  CO2 27  BUN 6  CREATININE 0.63  GLUCOSE 129*  CALCIUM 8.2*    Assessment and Plan: Doing well 1 day s/p right reverse TSA OT to teach hand, wrist, elbow rom only Can d/c today if pain under control with oral pain medications  Percocet for home pain control. Script in chart Fu with Dr. Tamera Punt in two weeks  VTE proph: ASA 325mg  BID, SCDs

## 2013-04-02 NOTE — Progress Notes (Signed)
Pt states Dr ordered wrong pain med Drs office paged.

## 2013-04-02 NOTE — Discharge Summary (Signed)
Patient ID: Michelle Perez MRN: [id number] DOB/AGE: [birth year] 58 y.o.  Admit date: 04/01/2013 Discharge date: 04/02/2013  Admission Diagnoses:  Active Problems:   Shoulder arthritis   Discharge Diagnoses:  Same  Past Medical History  Diagnosis Date  . Hyperlipemia   . Diabetes mellitus without complication     type 2 , diet and exercise controlled  . COPD (chronic obstructive pulmonary disease)   . GERD (gastroesophageal reflux disease)   . H/O hiatal hernia   . Arthritis   . Cancer     skin cancer - nose    Surgeries: Procedure(s): REVERSE SHOULDER ARTHROPLASTY on 04/01/2013   Consultants:    Discharged Condition: Improved  Hospital Course: Michelle Perez is an 58 y.o. female who was admitted 04/01/2013 for operative treatment of shoulder arthritis, rotator cuff tear .Chronic severe right shoulder pain that limits her daily activities and her sleep. She has had multiple previous surgeries and is left with painful dysfunctional shoulder. She has failed other treatments with activity modification, injection treatment, physical therapy and oral medications.   Patient has severe unremitting pain that affects sleep, daily activities, and work/hobbies. After pre-op clearance the patient was taken to the operating room on 04/01/2013 and underwent  Procedure(s): [place].    Patient was given perioperative antibiotics: Anti-infectives   Start     Dose/Rate Route Frequency Ordered Stop   04/01/13 1600  ceFAZolin (ANCEF) IVPB 1 g/50 mL premix     1 g 100 mL/hr over 30 Minutes Intravenous Every 6 hours 04/01/13 1258 04/02/13 0959   04/01/13 0600  ceFAZolin (ANCEF) IVPB 2 g/50 mL premix     2 g 100 mL/hr over 30 Minutes Intravenous On call to O.R. 03/31/13 1357 04/01/13 0754   04/01/13 0546  ceFAZolin (ANCEF) IVPB 2 g/50 mL premix  Status:  Discontinued     2 g 100 mL/hr over 30 Minutes Intravenous On call to O.R. 04/01/13 0546 04/01/13 1258       Patient was given  sequential compression devices, early ambulation, and ASA 325mg  BID to prevent DVT.  Patient benefited maximally from hospital stay and there were no complications.    Recent vital signs: Patient Vitals for the past 24 hrs:  BP Temp Pulse Resp SpO2  04/02/13 0544 132/51 mmHg 99.9 F (37.7 C) 82 18 94 %  04/02/13 0118 121/51 mmHg 99.9 F (37.7 C) 74 18 92 %  04/01/13 2114 126/54 mmHg 98.8 F (37.1 C) 59 18 94 %  04/01/13 2103 - - - - 100 %  04/01/13 1600 127/55 mmHg 97.9 F (36.6 C) 77 16 100 %  04/01/13 1530 - 97.7 F (36.5 C) - - -  04/01/13 1515 107/49 mmHg - 72 15 92 %     Recent laboratory studies:  Recent Labs  04/02/13 0550  WBC 8.8  HGB 10.7*  HCT 31.2*  PLT 186  NA 137  K 4.0  CL 100  CO2 27  BUN 6  CREATININE 0.63  GLUCOSE 129*  CALCIUM 8.2*     Discharge Medications:     Medication List         budesonide-formoterol 80-4.5 MCG/ACT inhaler  Commonly known as:  SYMBICORT  Inhale 2 puffs into the lungs 2 (two) times daily.     docusate sodium 100 MG capsule  Commonly known as:  COLACE  Take 1 capsule (100 mg total) by mouth 3 (three) times daily as needed.     esomeprazole 40 MG capsule  Commonly known  as:  NEXIUM  Take 40 mg by mouth daily at 12 noon.     estrogens (conjugated) 0.3 MG tablet  Commonly known as:  PREMARIN  Take 0.3 mg by mouth daily. Take daily for 21 days then do not take for 7 days.     oxyCODONE-acetaminophen 5-325 MG per tablet  Commonly known as:  ROXICET  Take 1-2 tablets by mouth every 4 (four) hours as needed for severe pain.     PROAIR HFA 108 (90 BASE) MCG/ACT inhaler  Generic drug:  albuterol  Inhale into the lungs every 6 (six) hours as needed for wheezing or shortness of breath.     rosuvastatin 5 MG tablet  Commonly known as:  CRESTOR  Take 5 mg by mouth daily.        Diagnostic Studies: Dg Shoulder Right Port  04/23/2013   CLINICAL DATA:  Right shoulder arthroplasty.  EXAM: PORTABLE RIGHT SHOULDER - 2+  VIEW  COMPARISON:  None.  FINDINGS: Hardware components from a total right shoulder arthroplasty are identified. The components are in anatomic alignment. There is no periprosthetic fracture or subluxation. A surgical drainage catheter overlies the proximal right humerus.  IMPRESSION: 1. Status post right total shoulder arthroplasty.   Electronically Signed   By: Kerby Moors M.D.   On: 04-23-13 12:16    Disposition: 01-Home or Self Care      Discharge Orders   Future Orders Complete By Expires   Call MD / Call 911  As directed    Comments:     If you experience chest pain or shortness of breath, CALL 911 and be transported to the hospital emergency room.  If you develope a fever above 101 F, pus (white drainage) or increased drainage or redness at the wound, or calf pain, call your surgeon's office.   Constipation Prevention  As directed    Comments:     Drink plenty of fluids.  Prune juice may be helpful.  You may use a stool softener, such as Colace (over the counter) 100 mg twice a day.  Use MiraLax (over the counter) for constipation as needed.   Diet - low sodium heart healthy  As directed    Increase activity slowly as tolerated  As directed       Follow-up Information   Follow up with CHANDLER, JESSE. Schedule an appointment as soon as possible for a visit in 2 weeks.   Contact information:   [place]  [address] [phone]        Signed: Grier Mitts 04/02/2013, 3:04 PM

## 2013-04-03 LAB — WOUND CULTURE: Culture: NO GROWTH

## 2013-04-05 ENCOUNTER — Encounter (HOSPITAL_COMMUNITY): Payer: Self-pay | Admitting: Orthopedic Surgery

## 2013-04-16 LAB — ANAEROBIC CULTURE

## 2014-05-11 DIAGNOSIS — K921 Melena: Secondary | ICD-10-CM | POA: Diagnosis not present

## 2014-05-11 DIAGNOSIS — K219 Gastro-esophageal reflux disease without esophagitis: Secondary | ICD-10-CM | POA: Diagnosis not present

## 2014-05-11 DIAGNOSIS — R8290 Unspecified abnormal findings in urine: Secondary | ICD-10-CM | POA: Diagnosis not present

## 2014-05-11 DIAGNOSIS — Z6824 Body mass index (BMI) 24.0-24.9, adult: Secondary | ICD-10-CM | POA: Diagnosis not present

## 2014-06-06 DIAGNOSIS — L570 Actinic keratosis: Secondary | ICD-10-CM | POA: Diagnosis not present

## 2014-06-06 DIAGNOSIS — L821 Other seborrheic keratosis: Secondary | ICD-10-CM | POA: Diagnosis not present

## 2014-06-06 DIAGNOSIS — L820 Inflamed seborrheic keratosis: Secondary | ICD-10-CM | POA: Diagnosis not present

## 2014-06-06 DIAGNOSIS — L814 Other melanin hyperpigmentation: Secondary | ICD-10-CM | POA: Diagnosis not present

## 2014-06-06 DIAGNOSIS — C44311 Basal cell carcinoma of skin of nose: Secondary | ICD-10-CM | POA: Diagnosis not present

## 2014-06-07 DIAGNOSIS — M545 Low back pain: Secondary | ICD-10-CM | POA: Diagnosis not present

## 2014-06-07 DIAGNOSIS — M47816 Spondylosis without myelopathy or radiculopathy, lumbar region: Secondary | ICD-10-CM | POA: Diagnosis not present

## 2014-06-07 DIAGNOSIS — M79604 Pain in right leg: Secondary | ICD-10-CM | POA: Diagnosis not present

## 2014-06-07 DIAGNOSIS — M79605 Pain in left leg: Secondary | ICD-10-CM | POA: Diagnosis not present

## 2014-06-07 DIAGNOSIS — M5126 Other intervertebral disc displacement, lumbar region: Secondary | ICD-10-CM | POA: Diagnosis not present

## 2014-06-21 DIAGNOSIS — R634 Abnormal weight loss: Secondary | ICD-10-CM | POA: Diagnosis not present

## 2014-06-21 DIAGNOSIS — K625 Hemorrhage of anus and rectum: Secondary | ICD-10-CM | POA: Diagnosis not present

## 2014-06-21 DIAGNOSIS — K219 Gastro-esophageal reflux disease without esophagitis: Secondary | ICD-10-CM | POA: Diagnosis not present

## 2014-06-21 DIAGNOSIS — Z86010 Personal history of colonic polyps: Secondary | ICD-10-CM | POA: Diagnosis not present

## 2014-06-27 DIAGNOSIS — Z79890 Hormone replacement therapy (postmenopausal): Secondary | ICD-10-CM | POA: Diagnosis not present

## 2014-06-27 DIAGNOSIS — K5730 Diverticulosis of large intestine without perforation or abscess without bleeding: Secondary | ICD-10-CM | POA: Diagnosis not present

## 2014-06-27 DIAGNOSIS — E780 Pure hypercholesterolemia: Secondary | ICD-10-CM | POA: Diagnosis not present

## 2014-06-27 DIAGNOSIS — G8929 Other chronic pain: Secondary | ICD-10-CM | POA: Diagnosis not present

## 2014-06-27 DIAGNOSIS — K644 Residual hemorrhoidal skin tags: Secondary | ICD-10-CM | POA: Diagnosis not present

## 2014-06-27 DIAGNOSIS — I10 Essential (primary) hypertension: Secondary | ICD-10-CM | POA: Diagnosis not present

## 2014-06-27 DIAGNOSIS — Z86010 Personal history of colonic polyps: Secondary | ICD-10-CM | POA: Diagnosis not present

## 2014-06-27 DIAGNOSIS — R634 Abnormal weight loss: Secondary | ICD-10-CM | POA: Diagnosis not present

## 2014-06-27 DIAGNOSIS — J449 Chronic obstructive pulmonary disease, unspecified: Secondary | ICD-10-CM | POA: Diagnosis not present

## 2014-06-27 DIAGNOSIS — K625 Hemorrhage of anus and rectum: Secondary | ICD-10-CM | POA: Diagnosis not present

## 2014-06-27 DIAGNOSIS — Z800 Family history of malignant neoplasm of digestive organs: Secondary | ICD-10-CM | POA: Diagnosis not present

## 2014-06-27 DIAGNOSIS — K219 Gastro-esophageal reflux disease without esophagitis: Secondary | ICD-10-CM | POA: Diagnosis not present

## 2014-06-27 DIAGNOSIS — K648 Other hemorrhoids: Secondary | ICD-10-CM | POA: Diagnosis not present

## 2014-06-27 DIAGNOSIS — M1990 Unspecified osteoarthritis, unspecified site: Secondary | ICD-10-CM | POA: Diagnosis not present

## 2014-06-27 DIAGNOSIS — E119 Type 2 diabetes mellitus without complications: Secondary | ICD-10-CM | POA: Diagnosis not present

## 2014-06-27 DIAGNOSIS — F17210 Nicotine dependence, cigarettes, uncomplicated: Secondary | ICD-10-CM | POA: Diagnosis not present

## 2014-06-27 DIAGNOSIS — G629 Polyneuropathy, unspecified: Secondary | ICD-10-CM | POA: Diagnosis not present

## 2014-06-27 DIAGNOSIS — M549 Dorsalgia, unspecified: Secondary | ICD-10-CM | POA: Diagnosis not present

## 2014-07-04 DIAGNOSIS — E782 Mixed hyperlipidemia: Secondary | ICD-10-CM | POA: Diagnosis not present

## 2014-07-04 DIAGNOSIS — E1142 Type 2 diabetes mellitus with diabetic polyneuropathy: Secondary | ICD-10-CM | POA: Diagnosis not present

## 2014-07-12 DIAGNOSIS — E1169 Type 2 diabetes mellitus with other specified complication: Secondary | ICD-10-CM | POA: Diagnosis not present

## 2014-07-12 DIAGNOSIS — J449 Chronic obstructive pulmonary disease, unspecified: Secondary | ICD-10-CM | POA: Diagnosis not present

## 2014-07-12 DIAGNOSIS — E785 Hyperlipidemia, unspecified: Secondary | ICD-10-CM | POA: Diagnosis not present

## 2014-07-12 DIAGNOSIS — E1142 Type 2 diabetes mellitus with diabetic polyneuropathy: Secondary | ICD-10-CM | POA: Diagnosis not present

## 2014-07-22 DIAGNOSIS — J449 Chronic obstructive pulmonary disease, unspecified: Secondary | ICD-10-CM | POA: Diagnosis not present

## 2014-08-03 DIAGNOSIS — M47816 Spondylosis without myelopathy or radiculopathy, lumbar region: Secondary | ICD-10-CM | POA: Diagnosis not present

## 2014-08-03 DIAGNOSIS — M545 Low back pain: Secondary | ICD-10-CM | POA: Diagnosis not present

## 2014-08-03 DIAGNOSIS — M5126 Other intervertebral disc displacement, lumbar region: Secondary | ICD-10-CM | POA: Diagnosis not present

## 2014-08-03 DIAGNOSIS — M5416 Radiculopathy, lumbar region: Secondary | ICD-10-CM | POA: Diagnosis not present

## 2014-08-03 DIAGNOSIS — M47896 Other spondylosis, lumbar region: Secondary | ICD-10-CM | POA: Diagnosis not present

## 2014-08-09 DIAGNOSIS — R1310 Dysphagia, unspecified: Secondary | ICD-10-CM | POA: Diagnosis not present

## 2014-08-09 DIAGNOSIS — K210 Gastro-esophageal reflux disease with esophagitis: Secondary | ICD-10-CM | POA: Diagnosis not present

## 2014-08-10 DIAGNOSIS — M7072 Other bursitis of hip, left hip: Secondary | ICD-10-CM | POA: Diagnosis not present

## 2014-08-10 DIAGNOSIS — M7071 Other bursitis of hip, right hip: Secondary | ICD-10-CM | POA: Diagnosis not present

## 2014-08-15 DIAGNOSIS — K219 Gastro-esophageal reflux disease without esophagitis: Secondary | ICD-10-CM | POA: Diagnosis not present

## 2014-08-15 DIAGNOSIS — R4702 Dysphasia: Secondary | ICD-10-CM | POA: Diagnosis not present

## 2014-08-15 DIAGNOSIS — K228 Other specified diseases of esophagus: Secondary | ICD-10-CM | POA: Diagnosis not present

## 2014-08-15 DIAGNOSIS — R0989 Other specified symptoms and signs involving the circulatory and respiratory systems: Secondary | ICD-10-CM | POA: Diagnosis not present

## 2014-08-15 DIAGNOSIS — K449 Diaphragmatic hernia without obstruction or gangrene: Secondary | ICD-10-CM | POA: Diagnosis not present

## 2014-08-16 DIAGNOSIS — J449 Chronic obstructive pulmonary disease, unspecified: Secondary | ICD-10-CM | POA: Diagnosis not present

## 2014-08-21 DIAGNOSIS — J449 Chronic obstructive pulmonary disease, unspecified: Secondary | ICD-10-CM | POA: Diagnosis not present

## 2014-08-24 DIAGNOSIS — M5126 Other intervertebral disc displacement, lumbar region: Secondary | ICD-10-CM | POA: Diagnosis not present

## 2014-08-24 DIAGNOSIS — M5416 Radiculopathy, lumbar region: Secondary | ICD-10-CM | POA: Diagnosis not present

## 2014-08-24 DIAGNOSIS — M47816 Spondylosis without myelopathy or radiculopathy, lumbar region: Secondary | ICD-10-CM | POA: Diagnosis not present

## 2014-08-31 DIAGNOSIS — F17210 Nicotine dependence, cigarettes, uncomplicated: Secondary | ICD-10-CM | POA: Diagnosis not present

## 2014-08-31 DIAGNOSIS — K210 Gastro-esophageal reflux disease with esophagitis: Secondary | ICD-10-CM | POA: Diagnosis not present

## 2014-08-31 DIAGNOSIS — K2970 Gastritis, unspecified, without bleeding: Secondary | ICD-10-CM | POA: Diagnosis not present

## 2014-08-31 DIAGNOSIS — Z79890 Hormone replacement therapy (postmenopausal): Secondary | ICD-10-CM | POA: Diagnosis not present

## 2014-08-31 DIAGNOSIS — K449 Diaphragmatic hernia without obstruction or gangrene: Secondary | ICD-10-CM | POA: Diagnosis not present

## 2014-08-31 DIAGNOSIS — I10 Essential (primary) hypertension: Secondary | ICD-10-CM | POA: Diagnosis not present

## 2014-08-31 DIAGNOSIS — E780 Pure hypercholesterolemia: Secondary | ICD-10-CM | POA: Diagnosis not present

## 2014-08-31 DIAGNOSIS — Q393 Congenital stenosis and stricture of esophagus: Secondary | ICD-10-CM | POA: Diagnosis not present

## 2014-08-31 DIAGNOSIS — K222 Esophageal obstruction: Secondary | ICD-10-CM | POA: Diagnosis not present

## 2014-08-31 DIAGNOSIS — J449 Chronic obstructive pulmonary disease, unspecified: Secondary | ICD-10-CM | POA: Diagnosis not present

## 2014-08-31 DIAGNOSIS — E119 Type 2 diabetes mellitus without complications: Secondary | ICD-10-CM | POA: Diagnosis not present

## 2014-08-31 DIAGNOSIS — K317 Polyp of stomach and duodenum: Secondary | ICD-10-CM | POA: Diagnosis not present

## 2014-08-31 DIAGNOSIS — R1310 Dysphagia, unspecified: Secondary | ICD-10-CM | POA: Diagnosis not present

## 2014-08-31 DIAGNOSIS — M1990 Unspecified osteoarthritis, unspecified site: Secondary | ICD-10-CM | POA: Diagnosis not present

## 2014-09-15 DIAGNOSIS — Z6823 Body mass index (BMI) 23.0-23.9, adult: Secondary | ICD-10-CM | POA: Diagnosis not present

## 2014-09-15 DIAGNOSIS — R109 Unspecified abdominal pain: Secondary | ICD-10-CM | POA: Diagnosis not present

## 2014-09-21 DIAGNOSIS — Z1389 Encounter for screening for other disorder: Secondary | ICD-10-CM | POA: Diagnosis not present

## 2014-09-21 DIAGNOSIS — Z139 Encounter for screening, unspecified: Secondary | ICD-10-CM | POA: Diagnosis not present

## 2014-09-21 DIAGNOSIS — Z0000 Encounter for general adult medical examination without abnormal findings: Secondary | ICD-10-CM | POA: Diagnosis not present

## 2014-09-21 DIAGNOSIS — J449 Chronic obstructive pulmonary disease, unspecified: Secondary | ICD-10-CM | POA: Diagnosis not present

## 2014-09-28 DIAGNOSIS — R109 Unspecified abdominal pain: Secondary | ICD-10-CM | POA: Diagnosis not present

## 2014-09-28 DIAGNOSIS — K7689 Other specified diseases of liver: Secondary | ICD-10-CM | POA: Diagnosis not present

## 2014-10-05 DIAGNOSIS — R109 Unspecified abdominal pain: Secondary | ICD-10-CM | POA: Diagnosis not present

## 2014-10-05 DIAGNOSIS — Z6823 Body mass index (BMI) 23.0-23.9, adult: Secondary | ICD-10-CM | POA: Diagnosis not present

## 2014-10-05 DIAGNOSIS — R140 Abdominal distension (gaseous): Secondary | ICD-10-CM | POA: Diagnosis not present

## 2014-10-14 DIAGNOSIS — R109 Unspecified abdominal pain: Secondary | ICD-10-CM | POA: Diagnosis not present

## 2014-10-14 DIAGNOSIS — K7689 Other specified diseases of liver: Secondary | ICD-10-CM | POA: Diagnosis not present

## 2014-10-14 DIAGNOSIS — R140 Abdominal distension (gaseous): Secondary | ICD-10-CM | POA: Diagnosis not present

## 2014-10-21 DIAGNOSIS — J449 Chronic obstructive pulmonary disease, unspecified: Secondary | ICD-10-CM | POA: Diagnosis not present

## 2014-11-03 DIAGNOSIS — E1142 Type 2 diabetes mellitus with diabetic polyneuropathy: Secondary | ICD-10-CM | POA: Diagnosis not present

## 2014-11-03 DIAGNOSIS — E039 Hypothyroidism, unspecified: Secondary | ICD-10-CM | POA: Diagnosis not present

## 2014-11-03 DIAGNOSIS — E1169 Type 2 diabetes mellitus with other specified complication: Secondary | ICD-10-CM | POA: Diagnosis not present

## 2014-11-10 DIAGNOSIS — E1169 Type 2 diabetes mellitus with other specified complication: Secondary | ICD-10-CM | POA: Diagnosis not present

## 2014-11-10 DIAGNOSIS — E785 Hyperlipidemia, unspecified: Secondary | ICD-10-CM | POA: Diagnosis not present

## 2014-11-10 DIAGNOSIS — E1142 Type 2 diabetes mellitus with diabetic polyneuropathy: Secondary | ICD-10-CM | POA: Diagnosis not present

## 2014-11-10 DIAGNOSIS — E039 Hypothyroidism, unspecified: Secondary | ICD-10-CM | POA: Diagnosis not present

## 2014-11-15 DIAGNOSIS — K222 Esophageal obstruction: Secondary | ICD-10-CM | POA: Diagnosis not present

## 2014-11-15 DIAGNOSIS — K219 Gastro-esophageal reflux disease without esophagitis: Secondary | ICD-10-CM | POA: Diagnosis not present

## 2014-11-15 DIAGNOSIS — R1012 Left upper quadrant pain: Secondary | ICD-10-CM | POA: Diagnosis not present

## 2014-11-21 DIAGNOSIS — J449 Chronic obstructive pulmonary disease, unspecified: Secondary | ICD-10-CM | POA: Diagnosis not present

## 2014-12-06 DIAGNOSIS — Z1231 Encounter for screening mammogram for malignant neoplasm of breast: Secondary | ICD-10-CM | POA: Diagnosis not present

## 2014-12-06 DIAGNOSIS — E1165 Type 2 diabetes mellitus with hyperglycemia: Secondary | ICD-10-CM | POA: Diagnosis not present

## 2014-12-06 DIAGNOSIS — Z6823 Body mass index (BMI) 23.0-23.9, adult: Secondary | ICD-10-CM | POA: Diagnosis not present

## 2014-12-07 DIAGNOSIS — G894 Chronic pain syndrome: Secondary | ICD-10-CM | POA: Diagnosis not present

## 2014-12-07 DIAGNOSIS — M7072 Other bursitis of hip, left hip: Secondary | ICD-10-CM | POA: Diagnosis not present

## 2014-12-07 DIAGNOSIS — M5416 Radiculopathy, lumbar region: Secondary | ICD-10-CM | POA: Diagnosis not present

## 2014-12-07 DIAGNOSIS — M5136 Other intervertebral disc degeneration, lumbar region: Secondary | ICD-10-CM | POA: Diagnosis not present

## 2014-12-07 DIAGNOSIS — F17200 Nicotine dependence, unspecified, uncomplicated: Secondary | ICD-10-CM | POA: Diagnosis not present

## 2014-12-07 DIAGNOSIS — M7071 Other bursitis of hip, right hip: Secondary | ICD-10-CM | POA: Diagnosis not present

## 2014-12-19 DIAGNOSIS — L820 Inflamed seborrheic keratosis: Secondary | ICD-10-CM | POA: Diagnosis not present

## 2014-12-19 DIAGNOSIS — Z1231 Encounter for screening mammogram for malignant neoplasm of breast: Secondary | ICD-10-CM | POA: Diagnosis not present

## 2014-12-19 DIAGNOSIS — L814 Other melanin hyperpigmentation: Secondary | ICD-10-CM | POA: Diagnosis not present

## 2014-12-22 DIAGNOSIS — J449 Chronic obstructive pulmonary disease, unspecified: Secondary | ICD-10-CM | POA: Diagnosis not present

## 2015-01-17 DIAGNOSIS — E1142 Type 2 diabetes mellitus with diabetic polyneuropathy: Secondary | ICD-10-CM | POA: Diagnosis not present

## 2015-01-17 DIAGNOSIS — Z6822 Body mass index (BMI) 22.0-22.9, adult: Secondary | ICD-10-CM | POA: Diagnosis not present

## 2015-01-21 DIAGNOSIS — J449 Chronic obstructive pulmonary disease, unspecified: Secondary | ICD-10-CM | POA: Diagnosis not present

## 2015-02-21 DIAGNOSIS — J449 Chronic obstructive pulmonary disease, unspecified: Secondary | ICD-10-CM | POA: Diagnosis not present

## 2015-02-28 DIAGNOSIS — J449 Chronic obstructive pulmonary disease, unspecified: Secondary | ICD-10-CM | POA: Diagnosis not present

## 2015-02-28 DIAGNOSIS — M5126 Other intervertebral disc displacement, lumbar region: Secondary | ICD-10-CM | POA: Diagnosis not present

## 2015-02-28 DIAGNOSIS — M5416 Radiculopathy, lumbar region: Secondary | ICD-10-CM | POA: Diagnosis not present

## 2015-02-28 DIAGNOSIS — M5136 Other intervertebral disc degeneration, lumbar region: Secondary | ICD-10-CM | POA: Diagnosis not present

## 2015-02-28 DIAGNOSIS — G894 Chronic pain syndrome: Secondary | ICD-10-CM | POA: Diagnosis not present

## 2015-02-28 DIAGNOSIS — E119 Type 2 diabetes mellitus without complications: Secondary | ICD-10-CM | POA: Diagnosis not present

## 2015-03-16 DIAGNOSIS — R1010 Upper abdominal pain, unspecified: Secondary | ICD-10-CM | POA: Diagnosis not present

## 2015-03-16 DIAGNOSIS — R634 Abnormal weight loss: Secondary | ICD-10-CM | POA: Diagnosis not present

## 2015-03-16 DIAGNOSIS — K222 Esophageal obstruction: Secondary | ICD-10-CM | POA: Diagnosis not present

## 2015-03-16 DIAGNOSIS — K219 Gastro-esophageal reflux disease without esophagitis: Secondary | ICD-10-CM | POA: Diagnosis not present

## 2015-03-17 DIAGNOSIS — R1010 Upper abdominal pain, unspecified: Secondary | ICD-10-CM | POA: Diagnosis not present

## 2015-03-21 DIAGNOSIS — R140 Abdominal distension (gaseous): Secondary | ICD-10-CM | POA: Diagnosis not present

## 2015-03-21 DIAGNOSIS — R1010 Upper abdominal pain, unspecified: Secondary | ICD-10-CM | POA: Diagnosis not present

## 2015-03-22 IMAGING — DX DG SHOULDER 2+V PORT*R*
1 series · 1 of 1 positions shown · non-contrast
Comparison: None.

CLINICAL DATA: Right shoulder arthroplasty.

EXAM:
PORTABLE RIGHT SHOULDER - 2+ VIEW

[ap]
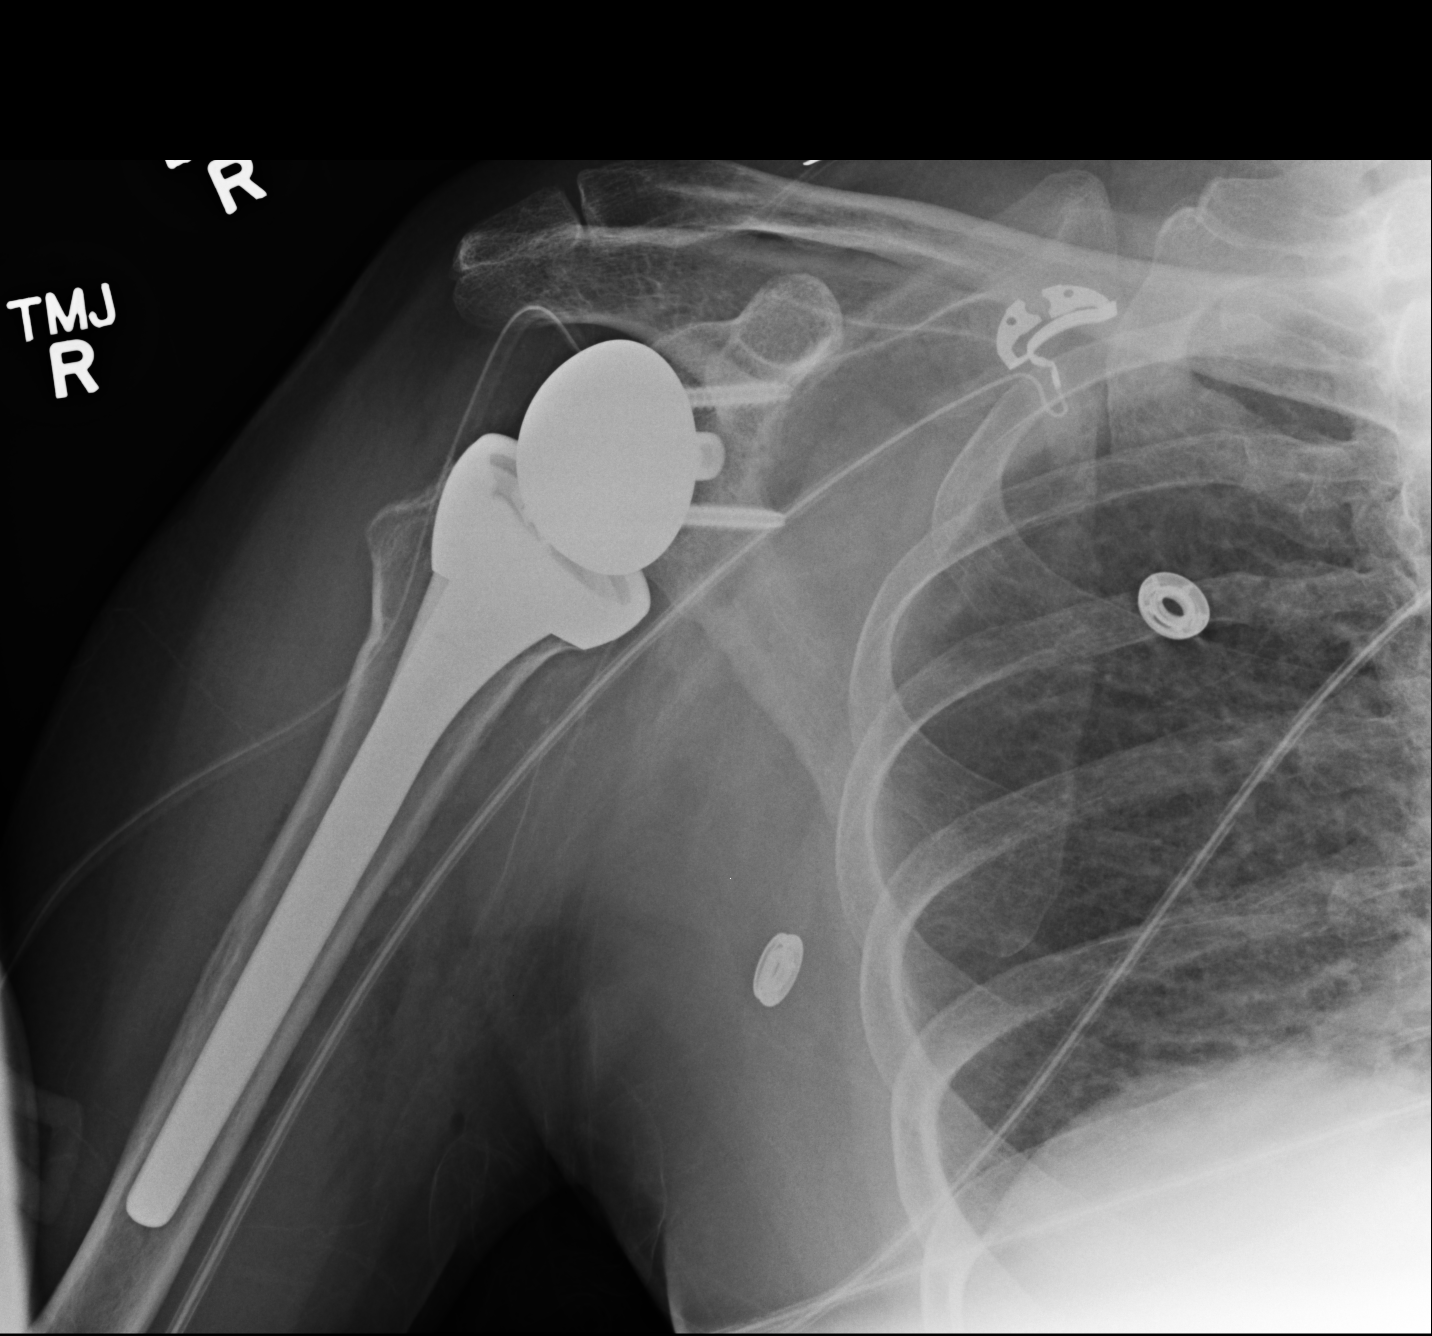

[1 of 1 positions shown; findings below may reference images not displayed]

FINDINGS: Hardware components from a total right shoulder arthroplasty are
identified. The components are in anatomic alignment. There is no
periprosthetic fracture or subluxation. A surgical drainage catheter
overlies the proximal right humerus.
IMPRESSION: 1. Status post right total shoulder arthroplasty.

## 2015-03-23 DIAGNOSIS — J449 Chronic obstructive pulmonary disease, unspecified: Secondary | ICD-10-CM | POA: Diagnosis not present

## 2015-04-11 DIAGNOSIS — E1169 Type 2 diabetes mellitus with other specified complication: Secondary | ICD-10-CM | POA: Diagnosis not present

## 2015-04-11 DIAGNOSIS — E039 Hypothyroidism, unspecified: Secondary | ICD-10-CM | POA: Diagnosis not present

## 2015-04-11 DIAGNOSIS — E1142 Type 2 diabetes mellitus with diabetic polyneuropathy: Secondary | ICD-10-CM | POA: Diagnosis not present

## 2015-04-18 DIAGNOSIS — L209 Atopic dermatitis, unspecified: Secondary | ICD-10-CM | POA: Diagnosis not present

## 2015-04-18 DIAGNOSIS — E1142 Type 2 diabetes mellitus with diabetic polyneuropathy: Secondary | ICD-10-CM | POA: Diagnosis not present

## 2015-04-18 DIAGNOSIS — R809 Proteinuria, unspecified: Secondary | ICD-10-CM | POA: Diagnosis not present

## 2015-04-18 DIAGNOSIS — E785 Hyperlipidemia, unspecified: Secondary | ICD-10-CM | POA: Diagnosis not present

## 2015-04-18 DIAGNOSIS — E1169 Type 2 diabetes mellitus with other specified complication: Secondary | ICD-10-CM | POA: Diagnosis not present

## 2015-05-23 DIAGNOSIS — R51 Headache: Secondary | ICD-10-CM | POA: Diagnosis not present

## 2015-05-31 DIAGNOSIS — M5416 Radiculopathy, lumbar region: Secondary | ICD-10-CM | POA: Diagnosis not present

## 2015-05-31 DIAGNOSIS — M5126 Other intervertebral disc displacement, lumbar region: Secondary | ICD-10-CM | POA: Diagnosis not present

## 2015-05-31 DIAGNOSIS — G894 Chronic pain syndrome: Secondary | ICD-10-CM | POA: Diagnosis not present

## 2015-05-31 DIAGNOSIS — M545 Low back pain: Secondary | ICD-10-CM | POA: Diagnosis not present

## 2015-05-31 DIAGNOSIS — M25551 Pain in right hip: Secondary | ICD-10-CM | POA: Diagnosis not present

## 2015-05-31 DIAGNOSIS — M5136 Other intervertebral disc degeneration, lumbar region: Secondary | ICD-10-CM | POA: Diagnosis not present

## 2015-06-06 DIAGNOSIS — R51 Headache: Secondary | ICD-10-CM | POA: Diagnosis not present

## 2015-06-06 DIAGNOSIS — R634 Abnormal weight loss: Secondary | ICD-10-CM | POA: Diagnosis not present

## 2015-06-06 DIAGNOSIS — F17200 Nicotine dependence, unspecified, uncomplicated: Secondary | ICD-10-CM | POA: Diagnosis not present

## 2015-06-06 DIAGNOSIS — R630 Anorexia: Secondary | ICD-10-CM | POA: Diagnosis not present

## 2015-06-07 DIAGNOSIS — L570 Actinic keratosis: Secondary | ICD-10-CM | POA: Diagnosis not present

## 2015-07-24 DIAGNOSIS — Z1211 Encounter for screening for malignant neoplasm of colon: Secondary | ICD-10-CM | POA: Diagnosis not present

## 2015-07-24 DIAGNOSIS — Z1212 Encounter for screening for malignant neoplasm of rectum: Secondary | ICD-10-CM | POA: Diagnosis not present

## 2015-08-01 DIAGNOSIS — E1142 Type 2 diabetes mellitus with diabetic polyneuropathy: Secondary | ICD-10-CM | POA: Diagnosis not present

## 2015-08-01 DIAGNOSIS — E1169 Type 2 diabetes mellitus with other specified complication: Secondary | ICD-10-CM | POA: Diagnosis not present

## 2015-08-08 DIAGNOSIS — E1169 Type 2 diabetes mellitus with other specified complication: Secondary | ICD-10-CM | POA: Diagnosis not present

## 2015-08-08 DIAGNOSIS — J449 Chronic obstructive pulmonary disease, unspecified: Secondary | ICD-10-CM | POA: Diagnosis not present

## 2015-08-08 DIAGNOSIS — E039 Hypothyroidism, unspecified: Secondary | ICD-10-CM | POA: Diagnosis not present

## 2015-08-08 DIAGNOSIS — E785 Hyperlipidemia, unspecified: Secondary | ICD-10-CM | POA: Diagnosis not present

## 2015-08-29 DIAGNOSIS — Z720 Tobacco use: Secondary | ICD-10-CM | POA: Diagnosis not present

## 2015-08-29 DIAGNOSIS — Z9181 History of falling: Secondary | ICD-10-CM | POA: Diagnosis not present

## 2015-08-29 DIAGNOSIS — Z0000 Encounter for general adult medical examination without abnormal findings: Secondary | ICD-10-CM | POA: Diagnosis not present

## 2015-08-29 DIAGNOSIS — Z1389 Encounter for screening for other disorder: Secondary | ICD-10-CM | POA: Diagnosis not present

## 2015-08-29 DIAGNOSIS — Z139 Encounter for screening, unspecified: Secondary | ICD-10-CM | POA: Diagnosis not present

## 2015-08-29 DIAGNOSIS — Z7189 Other specified counseling: Secondary | ICD-10-CM | POA: Diagnosis not present

## 2015-09-04 DIAGNOSIS — G894 Chronic pain syndrome: Secondary | ICD-10-CM | POA: Diagnosis not present

## 2015-09-04 DIAGNOSIS — M5136 Other intervertebral disc degeneration, lumbar region: Secondary | ICD-10-CM | POA: Diagnosis not present

## 2015-09-04 DIAGNOSIS — M533 Sacrococcygeal disorders, not elsewhere classified: Secondary | ICD-10-CM | POA: Diagnosis not present

## 2015-09-04 DIAGNOSIS — M5126 Other intervertebral disc displacement, lumbar region: Secondary | ICD-10-CM | POA: Diagnosis not present

## 2015-09-04 DIAGNOSIS — Z79891 Long term (current) use of opiate analgesic: Secondary | ICD-10-CM | POA: Diagnosis not present

## 2015-09-12 DIAGNOSIS — Z6821 Body mass index (BMI) 21.0-21.9, adult: Secondary | ICD-10-CM | POA: Diagnosis not present

## 2015-09-12 DIAGNOSIS — R634 Abnormal weight loss: Secondary | ICD-10-CM | POA: Diagnosis not present

## 2015-09-13 DIAGNOSIS — R634 Abnormal weight loss: Secondary | ICD-10-CM | POA: Diagnosis not present

## 2015-09-22 DIAGNOSIS — J449 Chronic obstructive pulmonary disease, unspecified: Secondary | ICD-10-CM | POA: Diagnosis not present

## 2015-09-22 DIAGNOSIS — R634 Abnormal weight loss: Secondary | ICD-10-CM | POA: Diagnosis not present

## 2015-09-22 DIAGNOSIS — Z6821 Body mass index (BMI) 21.0-21.9, adult: Secondary | ICD-10-CM | POA: Diagnosis not present

## 2015-10-12 DIAGNOSIS — J449 Chronic obstructive pulmonary disease, unspecified: Secondary | ICD-10-CM | POA: Diagnosis not present

## 2015-10-31 DIAGNOSIS — Z96611 Presence of right artificial shoulder joint: Secondary | ICD-10-CM | POA: Diagnosis not present

## 2015-10-31 DIAGNOSIS — E119 Type 2 diabetes mellitus without complications: Secondary | ICD-10-CM | POA: Diagnosis not present

## 2015-10-31 DIAGNOSIS — J449 Chronic obstructive pulmonary disease, unspecified: Secondary | ICD-10-CM | POA: Diagnosis not present

## 2015-10-31 DIAGNOSIS — M5416 Radiculopathy, lumbar region: Secondary | ICD-10-CM | POA: Diagnosis not present

## 2015-10-31 DIAGNOSIS — M5126 Other intervertebral disc displacement, lumbar region: Secondary | ICD-10-CM | POA: Diagnosis not present

## 2015-10-31 DIAGNOSIS — M5116 Intervertebral disc disorders with radiculopathy, lumbar region: Secondary | ICD-10-CM | POA: Diagnosis not present

## 2015-10-31 DIAGNOSIS — M5136 Other intervertebral disc degeneration, lumbar region: Secondary | ICD-10-CM | POA: Diagnosis not present

## 2015-10-31 DIAGNOSIS — K219 Gastro-esophageal reflux disease without esophagitis: Secondary | ICD-10-CM | POA: Diagnosis not present

## 2015-11-20 DIAGNOSIS — E119 Type 2 diabetes mellitus without complications: Secondary | ICD-10-CM | POA: Diagnosis not present

## 2015-11-28 DIAGNOSIS — H2511 Age-related nuclear cataract, right eye: Secondary | ICD-10-CM | POA: Diagnosis not present

## 2015-12-19 DIAGNOSIS — Z87891 Personal history of nicotine dependence: Secondary | ICD-10-CM | POA: Diagnosis not present

## 2015-12-19 DIAGNOSIS — K219 Gastro-esophageal reflux disease without esophagitis: Secondary | ICD-10-CM | POA: Diagnosis not present

## 2015-12-19 DIAGNOSIS — E119 Type 2 diabetes mellitus without complications: Secondary | ICD-10-CM | POA: Diagnosis not present

## 2015-12-19 DIAGNOSIS — G8929 Other chronic pain: Secondary | ICD-10-CM | POA: Diagnosis not present

## 2015-12-19 DIAGNOSIS — G629 Polyneuropathy, unspecified: Secondary | ICD-10-CM | POA: Diagnosis not present

## 2015-12-19 DIAGNOSIS — J449 Chronic obstructive pulmonary disease, unspecified: Secondary | ICD-10-CM | POA: Diagnosis not present

## 2015-12-19 DIAGNOSIS — Z79899 Other long term (current) drug therapy: Secondary | ICD-10-CM | POA: Diagnosis not present

## 2015-12-19 DIAGNOSIS — H2511 Age-related nuclear cataract, right eye: Secondary | ICD-10-CM | POA: Diagnosis not present

## 2016-01-04 DIAGNOSIS — D225 Melanocytic nevi of trunk: Secondary | ICD-10-CM | POA: Diagnosis not present

## 2016-01-04 DIAGNOSIS — L820 Inflamed seborrheic keratosis: Secondary | ICD-10-CM | POA: Diagnosis not present

## 2016-01-04 DIAGNOSIS — L821 Other seborrheic keratosis: Secondary | ICD-10-CM | POA: Diagnosis not present

## 2016-01-16 DIAGNOSIS — Z85828 Personal history of other malignant neoplasm of skin: Secondary | ICD-10-CM | POA: Diagnosis not present

## 2016-01-16 DIAGNOSIS — Z79899 Other long term (current) drug therapy: Secondary | ICD-10-CM | POA: Diagnosis not present

## 2016-01-16 DIAGNOSIS — E039 Hypothyroidism, unspecified: Secondary | ICD-10-CM | POA: Diagnosis not present

## 2016-01-16 DIAGNOSIS — K219 Gastro-esophageal reflux disease without esophagitis: Secondary | ICD-10-CM | POA: Diagnosis not present

## 2016-01-16 DIAGNOSIS — Z87891 Personal history of nicotine dependence: Secondary | ICD-10-CM | POA: Diagnosis not present

## 2016-01-16 DIAGNOSIS — E785 Hyperlipidemia, unspecified: Secondary | ICD-10-CM | POA: Diagnosis not present

## 2016-01-16 DIAGNOSIS — I10 Essential (primary) hypertension: Secondary | ICD-10-CM | POA: Diagnosis not present

## 2016-01-16 DIAGNOSIS — E119 Type 2 diabetes mellitus without complications: Secondary | ICD-10-CM | POA: Diagnosis not present

## 2016-01-16 DIAGNOSIS — J449 Chronic obstructive pulmonary disease, unspecified: Secondary | ICD-10-CM | POA: Diagnosis not present

## 2016-01-16 DIAGNOSIS — M1990 Unspecified osteoarthritis, unspecified site: Secondary | ICD-10-CM | POA: Diagnosis not present

## 2016-01-16 DIAGNOSIS — H2512 Age-related nuclear cataract, left eye: Secondary | ICD-10-CM | POA: Diagnosis not present

## 2016-01-17 DIAGNOSIS — H25812 Combined forms of age-related cataract, left eye: Secondary | ICD-10-CM | POA: Diagnosis not present

## 2016-02-01 DIAGNOSIS — E1142 Type 2 diabetes mellitus with diabetic polyneuropathy: Secondary | ICD-10-CM | POA: Diagnosis not present

## 2016-02-01 DIAGNOSIS — E1169 Type 2 diabetes mellitus with other specified complication: Secondary | ICD-10-CM | POA: Diagnosis not present

## 2016-02-01 DIAGNOSIS — J449 Chronic obstructive pulmonary disease, unspecified: Secondary | ICD-10-CM | POA: Diagnosis not present

## 2016-02-01 DIAGNOSIS — E039 Hypothyroidism, unspecified: Secondary | ICD-10-CM | POA: Diagnosis not present

## 2016-02-01 DIAGNOSIS — E785 Hyperlipidemia, unspecified: Secondary | ICD-10-CM | POA: Diagnosis not present

## 2016-02-07 DIAGNOSIS — M5136 Other intervertebral disc degeneration, lumbar region: Secondary | ICD-10-CM | POA: Diagnosis not present

## 2016-02-07 DIAGNOSIS — M5116 Intervertebral disc disorders with radiculopathy, lumbar region: Secondary | ICD-10-CM | POA: Diagnosis not present

## 2016-02-07 DIAGNOSIS — M533 Sacrococcygeal disorders, not elsewhere classified: Secondary | ICD-10-CM | POA: Diagnosis not present

## 2016-02-07 DIAGNOSIS — G894 Chronic pain syndrome: Secondary | ICD-10-CM | POA: Diagnosis not present

## 2016-02-07 DIAGNOSIS — M5126 Other intervertebral disc displacement, lumbar region: Secondary | ICD-10-CM | POA: Diagnosis not present

## 2016-02-07 DIAGNOSIS — Z79891 Long term (current) use of opiate analgesic: Secondary | ICD-10-CM | POA: Diagnosis not present

## 2016-02-07 DIAGNOSIS — M5416 Radiculopathy, lumbar region: Secondary | ICD-10-CM | POA: Diagnosis not present

## 2016-03-05 DIAGNOSIS — Z79899 Other long term (current) drug therapy: Secondary | ICD-10-CM | POA: Diagnosis not present

## 2016-03-05 DIAGNOSIS — G8929 Other chronic pain: Secondary | ICD-10-CM | POA: Diagnosis not present

## 2016-03-05 DIAGNOSIS — M9983 Other biomechanical lesions of lumbar region: Secondary | ICD-10-CM | POA: Diagnosis not present

## 2016-03-05 DIAGNOSIS — Z6821 Body mass index (BMI) 21.0-21.9, adult: Secondary | ICD-10-CM | POA: Diagnosis not present

## 2016-03-26 DIAGNOSIS — Z1231 Encounter for screening mammogram for malignant neoplasm of breast: Secondary | ICD-10-CM | POA: Diagnosis not present

## 2016-04-08 DIAGNOSIS — Z6821 Body mass index (BMI) 21.0-21.9, adult: Secondary | ICD-10-CM | POA: Diagnosis not present

## 2016-04-08 DIAGNOSIS — R05 Cough: Secondary | ICD-10-CM | POA: Diagnosis not present

## 2016-04-30 DIAGNOSIS — E785 Hyperlipidemia, unspecified: Secondary | ICD-10-CM | POA: Diagnosis not present

## 2016-04-30 DIAGNOSIS — E1142 Type 2 diabetes mellitus with diabetic polyneuropathy: Secondary | ICD-10-CM | POA: Diagnosis not present

## 2016-04-30 DIAGNOSIS — E1169 Type 2 diabetes mellitus with other specified complication: Secondary | ICD-10-CM | POA: Diagnosis not present

## 2016-04-30 DIAGNOSIS — J449 Chronic obstructive pulmonary disease, unspecified: Secondary | ICD-10-CM | POA: Diagnosis not present

## 2016-05-01 DIAGNOSIS — M5126 Other intervertebral disc displacement, lumbar region: Secondary | ICD-10-CM | POA: Diagnosis not present

## 2016-05-01 DIAGNOSIS — G894 Chronic pain syndrome: Secondary | ICD-10-CM | POA: Diagnosis not present

## 2016-05-01 DIAGNOSIS — M5116 Intervertebral disc disorders with radiculopathy, lumbar region: Secondary | ICD-10-CM | POA: Diagnosis not present

## 2016-05-01 DIAGNOSIS — M5416 Radiculopathy, lumbar region: Secondary | ICD-10-CM | POA: Diagnosis not present

## 2016-05-01 DIAGNOSIS — Z79891 Long term (current) use of opiate analgesic: Secondary | ICD-10-CM | POA: Diagnosis not present

## 2016-05-01 DIAGNOSIS — M533 Sacrococcygeal disorders, not elsewhere classified: Secondary | ICD-10-CM | POA: Diagnosis not present

## 2016-05-01 DIAGNOSIS — M5136 Other intervertebral disc degeneration, lumbar region: Secondary | ICD-10-CM | POA: Diagnosis not present

## 2016-06-04 DIAGNOSIS — M4726 Other spondylosis with radiculopathy, lumbar region: Secondary | ICD-10-CM | POA: Diagnosis not present

## 2016-06-04 DIAGNOSIS — Z85828 Personal history of other malignant neoplasm of skin: Secondary | ICD-10-CM | POA: Diagnosis not present

## 2016-06-04 DIAGNOSIS — G894 Chronic pain syndrome: Secondary | ICD-10-CM | POA: Diagnosis not present

## 2016-06-04 DIAGNOSIS — M5416 Radiculopathy, lumbar region: Secondary | ICD-10-CM | POA: Diagnosis not present

## 2016-06-04 DIAGNOSIS — E119 Type 2 diabetes mellitus without complications: Secondary | ICD-10-CM | POA: Diagnosis not present

## 2016-06-04 DIAGNOSIS — J449 Chronic obstructive pulmonary disease, unspecified: Secondary | ICD-10-CM | POA: Diagnosis not present

## 2016-06-04 DIAGNOSIS — M5136 Other intervertebral disc degeneration, lumbar region: Secondary | ICD-10-CM | POA: Diagnosis not present

## 2016-06-04 DIAGNOSIS — M47816 Spondylosis without myelopathy or radiculopathy, lumbar region: Secondary | ICD-10-CM | POA: Diagnosis not present

## 2016-06-13 DIAGNOSIS — M47816 Spondylosis without myelopathy or radiculopathy, lumbar region: Secondary | ICD-10-CM | POA: Diagnosis not present

## 2016-06-13 DIAGNOSIS — M5416 Radiculopathy, lumbar region: Secondary | ICD-10-CM | POA: Diagnosis not present

## 2016-06-13 DIAGNOSIS — M5126 Other intervertebral disc displacement, lumbar region: Secondary | ICD-10-CM | POA: Diagnosis not present

## 2016-06-13 DIAGNOSIS — G894 Chronic pain syndrome: Secondary | ICD-10-CM | POA: Diagnosis not present

## 2016-06-20 DIAGNOSIS — M47816 Spondylosis without myelopathy or radiculopathy, lumbar region: Secondary | ICD-10-CM | POA: Diagnosis not present

## 2016-06-20 DIAGNOSIS — M4317 Spondylolisthesis, lumbosacral region: Secondary | ICD-10-CM | POA: Diagnosis not present

## 2016-06-20 DIAGNOSIS — M5416 Radiculopathy, lumbar region: Secondary | ICD-10-CM | POA: Diagnosis not present

## 2016-06-20 DIAGNOSIS — M5136 Other intervertebral disc degeneration, lumbar region: Secondary | ICD-10-CM | POA: Diagnosis not present

## 2016-06-20 DIAGNOSIS — G894 Chronic pain syndrome: Secondary | ICD-10-CM | POA: Diagnosis not present

## 2016-07-26 DIAGNOSIS — M5416 Radiculopathy, lumbar region: Secondary | ICD-10-CM | POA: Diagnosis not present

## 2016-07-26 DIAGNOSIS — M4300 Spondylolysis, site unspecified: Secondary | ICD-10-CM | POA: Diagnosis not present

## 2016-07-26 DIAGNOSIS — M5136 Other intervertebral disc degeneration, lumbar region: Secondary | ICD-10-CM | POA: Diagnosis not present

## 2016-07-26 DIAGNOSIS — M4310 Spondylolisthesis, site unspecified: Secondary | ICD-10-CM | POA: Diagnosis not present

## 2016-07-31 DIAGNOSIS — Z87891 Personal history of nicotine dependence: Secondary | ICD-10-CM | POA: Diagnosis not present

## 2016-07-31 DIAGNOSIS — M5116 Intervertebral disc disorders with radiculopathy, lumbar region: Secondary | ICD-10-CM | POA: Diagnosis not present

## 2016-07-31 DIAGNOSIS — Z79891 Long term (current) use of opiate analgesic: Secondary | ICD-10-CM | POA: Diagnosis not present

## 2016-07-31 DIAGNOSIS — G894 Chronic pain syndrome: Secondary | ICD-10-CM | POA: Diagnosis not present

## 2016-07-31 DIAGNOSIS — M5136 Other intervertebral disc degeneration, lumbar region: Secondary | ICD-10-CM | POA: Diagnosis not present

## 2016-07-31 DIAGNOSIS — M47816 Spondylosis without myelopathy or radiculopathy, lumbar region: Secondary | ICD-10-CM | POA: Diagnosis not present

## 2016-07-31 DIAGNOSIS — M5416 Radiculopathy, lumbar region: Secondary | ICD-10-CM | POA: Diagnosis not present

## 2016-07-31 DIAGNOSIS — J449 Chronic obstructive pulmonary disease, unspecified: Secondary | ICD-10-CM | POA: Diagnosis not present

## 2016-08-06 DIAGNOSIS — E1142 Type 2 diabetes mellitus with diabetic polyneuropathy: Secondary | ICD-10-CM | POA: Diagnosis not present

## 2016-08-06 DIAGNOSIS — E1169 Type 2 diabetes mellitus with other specified complication: Secondary | ICD-10-CM | POA: Diagnosis not present

## 2016-08-08 DIAGNOSIS — R350 Frequency of micturition: Secondary | ICD-10-CM | POA: Diagnosis not present

## 2016-08-08 DIAGNOSIS — R1110 Vomiting, unspecified: Secondary | ICD-10-CM | POA: Diagnosis not present

## 2016-08-08 DIAGNOSIS — Z6820 Body mass index (BMI) 20.0-20.9, adult: Secondary | ICD-10-CM | POA: Diagnosis not present

## 2016-08-08 DIAGNOSIS — R197 Diarrhea, unspecified: Secondary | ICD-10-CM | POA: Diagnosis not present

## 2016-08-13 DIAGNOSIS — E785 Hyperlipidemia, unspecified: Secondary | ICD-10-CM | POA: Diagnosis not present

## 2016-08-13 DIAGNOSIS — J449 Chronic obstructive pulmonary disease, unspecified: Secondary | ICD-10-CM | POA: Diagnosis not present

## 2016-08-13 DIAGNOSIS — E1142 Type 2 diabetes mellitus with diabetic polyneuropathy: Secondary | ICD-10-CM | POA: Diagnosis not present

## 2016-08-13 DIAGNOSIS — E1169 Type 2 diabetes mellitus with other specified complication: Secondary | ICD-10-CM | POA: Diagnosis not present

## 2016-09-03 DIAGNOSIS — D225 Melanocytic nevi of trunk: Secondary | ICD-10-CM | POA: Diagnosis not present

## 2016-09-03 DIAGNOSIS — D2239 Melanocytic nevi of other parts of face: Secondary | ICD-10-CM | POA: Diagnosis not present

## 2016-09-03 DIAGNOSIS — L570 Actinic keratosis: Secondary | ICD-10-CM | POA: Diagnosis not present

## 2016-09-03 DIAGNOSIS — L821 Other seborrheic keratosis: Secondary | ICD-10-CM | POA: Diagnosis not present

## 2016-10-31 DIAGNOSIS — M5136 Other intervertebral disc degeneration, lumbar region: Secondary | ICD-10-CM | POA: Diagnosis not present

## 2016-10-31 DIAGNOSIS — M79601 Pain in right arm: Secondary | ICD-10-CM | POA: Diagnosis not present

## 2016-10-31 DIAGNOSIS — M545 Low back pain: Secondary | ICD-10-CM | POA: Diagnosis not present

## 2016-10-31 DIAGNOSIS — M533 Sacrococcygeal disorders, not elsewhere classified: Secondary | ICD-10-CM | POA: Diagnosis not present

## 2016-10-31 DIAGNOSIS — G894 Chronic pain syndrome: Secondary | ICD-10-CM | POA: Diagnosis not present

## 2016-10-31 DIAGNOSIS — M5416 Radiculopathy, lumbar region: Secondary | ICD-10-CM | POA: Diagnosis not present

## 2016-11-13 DIAGNOSIS — E559 Vitamin D deficiency, unspecified: Secondary | ICD-10-CM | POA: Diagnosis not present

## 2016-11-13 DIAGNOSIS — E039 Hypothyroidism, unspecified: Secondary | ICD-10-CM | POA: Diagnosis not present

## 2016-11-13 DIAGNOSIS — E1142 Type 2 diabetes mellitus with diabetic polyneuropathy: Secondary | ICD-10-CM | POA: Diagnosis not present

## 2016-11-13 DIAGNOSIS — E1169 Type 2 diabetes mellitus with other specified complication: Secondary | ICD-10-CM | POA: Diagnosis not present

## 2016-11-21 DIAGNOSIS — J3089 Other allergic rhinitis: Secondary | ICD-10-CM | POA: Diagnosis not present

## 2016-11-21 DIAGNOSIS — E1169 Type 2 diabetes mellitus with other specified complication: Secondary | ICD-10-CM | POA: Diagnosis not present

## 2016-11-21 DIAGNOSIS — Z1389 Encounter for screening for other disorder: Secondary | ICD-10-CM | POA: Diagnosis not present

## 2016-11-21 DIAGNOSIS — Z0000 Encounter for general adult medical examination without abnormal findings: Secondary | ICD-10-CM | POA: Diagnosis not present

## 2016-11-21 DIAGNOSIS — J449 Chronic obstructive pulmonary disease, unspecified: Secondary | ICD-10-CM | POA: Diagnosis not present

## 2016-11-21 DIAGNOSIS — E1142 Type 2 diabetes mellitus with diabetic polyneuropathy: Secondary | ICD-10-CM | POA: Diagnosis not present

## 2016-11-21 DIAGNOSIS — E039 Hypothyroidism, unspecified: Secondary | ICD-10-CM | POA: Diagnosis not present

## 2016-11-21 DIAGNOSIS — Z139 Encounter for screening, unspecified: Secondary | ICD-10-CM | POA: Diagnosis not present

## 2016-11-22 DIAGNOSIS — J301 Allergic rhinitis due to pollen: Secondary | ICD-10-CM | POA: Diagnosis not present

## 2016-11-22 DIAGNOSIS — J3089 Other allergic rhinitis: Secondary | ICD-10-CM | POA: Diagnosis not present

## 2016-11-26 DIAGNOSIS — J3089 Other allergic rhinitis: Secondary | ICD-10-CM | POA: Diagnosis not present

## 2016-11-26 DIAGNOSIS — J301 Allergic rhinitis due to pollen: Secondary | ICD-10-CM | POA: Diagnosis not present

## 2016-11-27 DIAGNOSIS — J3089 Other allergic rhinitis: Secondary | ICD-10-CM | POA: Diagnosis not present

## 2016-11-27 DIAGNOSIS — J301 Allergic rhinitis due to pollen: Secondary | ICD-10-CM | POA: Diagnosis not present

## 2016-11-28 DIAGNOSIS — J301 Allergic rhinitis due to pollen: Secondary | ICD-10-CM | POA: Diagnosis not present

## 2016-11-28 DIAGNOSIS — J3089 Other allergic rhinitis: Secondary | ICD-10-CM | POA: Diagnosis not present

## 2016-11-29 DIAGNOSIS — J3089 Other allergic rhinitis: Secondary | ICD-10-CM | POA: Diagnosis not present

## 2016-11-29 DIAGNOSIS — J301 Allergic rhinitis due to pollen: Secondary | ICD-10-CM | POA: Diagnosis not present

## 2016-12-02 DIAGNOSIS — J301 Allergic rhinitis due to pollen: Secondary | ICD-10-CM | POA: Diagnosis not present

## 2016-12-02 DIAGNOSIS — J3089 Other allergic rhinitis: Secondary | ICD-10-CM | POA: Diagnosis not present

## 2016-12-03 DIAGNOSIS — M1990 Unspecified osteoarthritis, unspecified site: Secondary | ICD-10-CM | POA: Diagnosis not present

## 2016-12-03 DIAGNOSIS — M5136 Other intervertebral disc degeneration, lumbar region: Secondary | ICD-10-CM | POA: Diagnosis not present

## 2016-12-03 DIAGNOSIS — J3089 Other allergic rhinitis: Secondary | ICD-10-CM | POA: Diagnosis not present

## 2016-12-03 DIAGNOSIS — J301 Allergic rhinitis due to pollen: Secondary | ICD-10-CM | POA: Diagnosis not present

## 2016-12-03 DIAGNOSIS — M461 Sacroiliitis, not elsewhere classified: Secondary | ICD-10-CM | POA: Diagnosis not present

## 2016-12-31 DIAGNOSIS — M461 Sacroiliitis, not elsewhere classified: Secondary | ICD-10-CM | POA: Diagnosis not present

## 2016-12-31 DIAGNOSIS — M5136 Other intervertebral disc degeneration, lumbar region: Secondary | ICD-10-CM | POA: Diagnosis not present

## 2016-12-31 DIAGNOSIS — M1990 Unspecified osteoarthritis, unspecified site: Secondary | ICD-10-CM | POA: Diagnosis not present

## 2017-01-28 DIAGNOSIS — M1990 Unspecified osteoarthritis, unspecified site: Secondary | ICD-10-CM | POA: Diagnosis not present

## 2017-01-28 DIAGNOSIS — M5136 Other intervertebral disc degeneration, lumbar region: Secondary | ICD-10-CM | POA: Diagnosis not present

## 2017-01-28 DIAGNOSIS — M461 Sacroiliitis, not elsewhere classified: Secondary | ICD-10-CM | POA: Diagnosis not present

## 2017-02-07 DIAGNOSIS — E1142 Type 2 diabetes mellitus with diabetic polyneuropathy: Secondary | ICD-10-CM | POA: Diagnosis not present

## 2017-02-07 DIAGNOSIS — E559 Vitamin D deficiency, unspecified: Secondary | ICD-10-CM | POA: Diagnosis not present

## 2017-02-07 DIAGNOSIS — E1169 Type 2 diabetes mellitus with other specified complication: Secondary | ICD-10-CM | POA: Diagnosis not present

## 2017-02-07 DIAGNOSIS — E039 Hypothyroidism, unspecified: Secondary | ICD-10-CM | POA: Diagnosis not present

## 2017-02-20 DIAGNOSIS — E1169 Type 2 diabetes mellitus with other specified complication: Secondary | ICD-10-CM | POA: Diagnosis not present

## 2017-02-20 DIAGNOSIS — E785 Hyperlipidemia, unspecified: Secondary | ICD-10-CM | POA: Diagnosis not present

## 2017-02-20 DIAGNOSIS — E1142 Type 2 diabetes mellitus with diabetic polyneuropathy: Secondary | ICD-10-CM | POA: Diagnosis not present

## 2017-02-20 DIAGNOSIS — Z23 Encounter for immunization: Secondary | ICD-10-CM | POA: Diagnosis not present

## 2017-02-20 DIAGNOSIS — E039 Hypothyroidism, unspecified: Secondary | ICD-10-CM | POA: Diagnosis not present

## 2017-02-20 DIAGNOSIS — Z139 Encounter for screening, unspecified: Secondary | ICD-10-CM | POA: Diagnosis not present

## 2017-02-25 DIAGNOSIS — M25551 Pain in right hip: Secondary | ICD-10-CM | POA: Diagnosis not present

## 2017-02-25 DIAGNOSIS — M1990 Unspecified osteoarthritis, unspecified site: Secondary | ICD-10-CM | POA: Diagnosis not present

## 2017-02-25 DIAGNOSIS — M5136 Other intervertebral disc degeneration, lumbar region: Secondary | ICD-10-CM | POA: Diagnosis not present

## 2017-02-25 DIAGNOSIS — M25559 Pain in unspecified hip: Secondary | ICD-10-CM | POA: Diagnosis not present

## 2017-02-25 DIAGNOSIS — M461 Sacroiliitis, not elsewhere classified: Secondary | ICD-10-CM | POA: Diagnosis not present

## 2017-04-15 DIAGNOSIS — G894 Chronic pain syndrome: Secondary | ICD-10-CM | POA: Diagnosis not present

## 2017-04-15 DIAGNOSIS — M533 Sacrococcygeal disorders, not elsewhere classified: Secondary | ICD-10-CM | POA: Diagnosis not present

## 2017-04-15 DIAGNOSIS — M5136 Other intervertebral disc degeneration, lumbar region: Secondary | ICD-10-CM | POA: Diagnosis not present

## 2017-04-15 DIAGNOSIS — M5416 Radiculopathy, lumbar region: Secondary | ICD-10-CM | POA: Diagnosis not present

## 2017-05-13 DIAGNOSIS — M5136 Other intervertebral disc degeneration, lumbar region: Secondary | ICD-10-CM | POA: Diagnosis not present

## 2017-05-13 DIAGNOSIS — M5416 Radiculopathy, lumbar region: Secondary | ICD-10-CM | POA: Diagnosis not present

## 2017-05-13 DIAGNOSIS — G894 Chronic pain syndrome: Secondary | ICD-10-CM | POA: Diagnosis not present

## 2017-05-13 DIAGNOSIS — M533 Sacrococcygeal disorders, not elsewhere classified: Secondary | ICD-10-CM | POA: Diagnosis not present

## 2017-05-14 DIAGNOSIS — E559 Vitamin D deficiency, unspecified: Secondary | ICD-10-CM | POA: Diagnosis not present

## 2017-05-14 DIAGNOSIS — E039 Hypothyroidism, unspecified: Secondary | ICD-10-CM | POA: Diagnosis not present

## 2017-05-14 DIAGNOSIS — E1169 Type 2 diabetes mellitus with other specified complication: Secondary | ICD-10-CM | POA: Diagnosis not present

## 2017-05-14 DIAGNOSIS — E1142 Type 2 diabetes mellitus with diabetic polyneuropathy: Secondary | ICD-10-CM | POA: Diagnosis not present

## 2017-05-22 DIAGNOSIS — E1169 Type 2 diabetes mellitus with other specified complication: Secondary | ICD-10-CM | POA: Diagnosis not present

## 2017-05-22 DIAGNOSIS — E785 Hyperlipidemia, unspecified: Secondary | ICD-10-CM | POA: Diagnosis not present

## 2017-05-22 DIAGNOSIS — J449 Chronic obstructive pulmonary disease, unspecified: Secondary | ICD-10-CM | POA: Diagnosis not present

## 2017-05-22 DIAGNOSIS — E1142 Type 2 diabetes mellitus with diabetic polyneuropathy: Secondary | ICD-10-CM | POA: Diagnosis not present

## 2017-06-05 DIAGNOSIS — Z6821 Body mass index (BMI) 21.0-21.9, adult: Secondary | ICD-10-CM | POA: Diagnosis not present

## 2017-06-05 DIAGNOSIS — E1142 Type 2 diabetes mellitus with diabetic polyneuropathy: Secondary | ICD-10-CM | POA: Diagnosis not present

## 2017-07-14 DIAGNOSIS — D2239 Melanocytic nevi of other parts of face: Secondary | ICD-10-CM | POA: Diagnosis not present

## 2017-07-14 DIAGNOSIS — L814 Other melanin hyperpigmentation: Secondary | ICD-10-CM | POA: Diagnosis not present

## 2017-07-14 DIAGNOSIS — B079 Viral wart, unspecified: Secondary | ICD-10-CM | POA: Diagnosis not present

## 2017-07-14 DIAGNOSIS — L820 Inflamed seborrheic keratosis: Secondary | ICD-10-CM | POA: Diagnosis not present

## 2017-07-16 DIAGNOSIS — G8929 Other chronic pain: Secondary | ICD-10-CM | POA: Diagnosis not present

## 2017-07-16 DIAGNOSIS — M533 Sacrococcygeal disorders, not elsewhere classified: Secondary | ICD-10-CM | POA: Diagnosis not present

## 2017-07-16 DIAGNOSIS — M25551 Pain in right hip: Secondary | ICD-10-CM | POA: Diagnosis not present

## 2017-07-16 DIAGNOSIS — G894 Chronic pain syndrome: Secondary | ICD-10-CM | POA: Diagnosis not present

## 2017-07-16 DIAGNOSIS — M5416 Radiculopathy, lumbar region: Secondary | ICD-10-CM | POA: Diagnosis not present

## 2017-07-16 DIAGNOSIS — M5136 Other intervertebral disc degeneration, lumbar region: Secondary | ICD-10-CM | POA: Diagnosis not present

## 2017-08-12 DIAGNOSIS — Z7189 Other specified counseling: Secondary | ICD-10-CM | POA: Diagnosis not present

## 2017-08-12 DIAGNOSIS — B070 Plantar wart: Secondary | ICD-10-CM | POA: Diagnosis not present

## 2017-08-12 DIAGNOSIS — Z6822 Body mass index (BMI) 22.0-22.9, adult: Secondary | ICD-10-CM | POA: Diagnosis not present

## 2017-08-12 DIAGNOSIS — Z139 Encounter for screening, unspecified: Secondary | ICD-10-CM | POA: Diagnosis not present

## 2017-08-12 DIAGNOSIS — E1142 Type 2 diabetes mellitus with diabetic polyneuropathy: Secondary | ICD-10-CM | POA: Diagnosis not present

## 2017-10-02 DIAGNOSIS — E1169 Type 2 diabetes mellitus with other specified complication: Secondary | ICD-10-CM | POA: Diagnosis not present

## 2017-10-02 DIAGNOSIS — E1142 Type 2 diabetes mellitus with diabetic polyneuropathy: Secondary | ICD-10-CM | POA: Diagnosis not present

## 2017-10-09 DIAGNOSIS — Z0000 Encounter for general adult medical examination without abnormal findings: Secondary | ICD-10-CM | POA: Diagnosis not present

## 2017-10-09 DIAGNOSIS — E1169 Type 2 diabetes mellitus with other specified complication: Secondary | ICD-10-CM | POA: Diagnosis not present

## 2017-10-09 DIAGNOSIS — E785 Hyperlipidemia, unspecified: Secondary | ICD-10-CM | POA: Diagnosis not present

## 2017-10-09 DIAGNOSIS — E1142 Type 2 diabetes mellitus with diabetic polyneuropathy: Secondary | ICD-10-CM | POA: Diagnosis not present

## 2017-10-09 DIAGNOSIS — Z139 Encounter for screening, unspecified: Secondary | ICD-10-CM | POA: Diagnosis not present

## 2017-10-09 DIAGNOSIS — E039 Hypothyroidism, unspecified: Secondary | ICD-10-CM | POA: Diagnosis not present

## 2017-10-14 DIAGNOSIS — M5416 Radiculopathy, lumbar region: Secondary | ICD-10-CM | POA: Diagnosis not present

## 2017-10-14 DIAGNOSIS — M25551 Pain in right hip: Secondary | ICD-10-CM | POA: Diagnosis not present

## 2017-10-14 DIAGNOSIS — G894 Chronic pain syndrome: Secondary | ICD-10-CM | POA: Diagnosis not present

## 2017-10-14 DIAGNOSIS — M5136 Other intervertebral disc degeneration, lumbar region: Secondary | ICD-10-CM | POA: Diagnosis not present

## 2017-10-14 DIAGNOSIS — M533 Sacrococcygeal disorders, not elsewhere classified: Secondary | ICD-10-CM | POA: Diagnosis not present

## 2017-10-31 DIAGNOSIS — Z1231 Encounter for screening mammogram for malignant neoplasm of breast: Secondary | ICD-10-CM | POA: Diagnosis not present

## 2017-11-17 DIAGNOSIS — Z6822 Body mass index (BMI) 22.0-22.9, adult: Secondary | ICD-10-CM | POA: Diagnosis not present

## 2017-11-17 DIAGNOSIS — K219 Gastro-esophageal reflux disease without esophagitis: Secondary | ICD-10-CM | POA: Diagnosis not present

## 2017-11-17 DIAGNOSIS — Z139 Encounter for screening, unspecified: Secondary | ICD-10-CM | POA: Diagnosis not present

## 2017-11-17 DIAGNOSIS — Z1211 Encounter for screening for malignant neoplasm of colon: Secondary | ICD-10-CM | POA: Diagnosis not present

## 2017-11-17 DIAGNOSIS — Z0000 Encounter for general adult medical examination without abnormal findings: Secondary | ICD-10-CM | POA: Diagnosis not present

## 2017-11-28 DIAGNOSIS — Z6822 Body mass index (BMI) 22.0-22.9, adult: Secondary | ICD-10-CM | POA: Diagnosis not present

## 2017-11-28 DIAGNOSIS — E1129 Type 2 diabetes mellitus with other diabetic kidney complication: Secondary | ICD-10-CM | POA: Diagnosis not present

## 2017-11-28 DIAGNOSIS — Z139 Encounter for screening, unspecified: Secondary | ICD-10-CM | POA: Diagnosis not present

## 2017-12-02 DIAGNOSIS — E119 Type 2 diabetes mellitus without complications: Secondary | ICD-10-CM | POA: Diagnosis not present

## 2018-01-06 DIAGNOSIS — M47816 Spondylosis without myelopathy or radiculopathy, lumbar region: Secondary | ICD-10-CM | POA: Diagnosis not present

## 2018-01-06 DIAGNOSIS — M5416 Radiculopathy, lumbar region: Secondary | ICD-10-CM | POA: Diagnosis not present

## 2018-01-06 DIAGNOSIS — G894 Chronic pain syndrome: Secondary | ICD-10-CM | POA: Diagnosis not present

## 2018-01-06 DIAGNOSIS — M533 Sacrococcygeal disorders, not elsewhere classified: Secondary | ICD-10-CM | POA: Diagnosis not present

## 2018-01-06 DIAGNOSIS — M25551 Pain in right hip: Secondary | ICD-10-CM | POA: Diagnosis not present

## 2018-01-06 DIAGNOSIS — M5136 Other intervertebral disc degeneration, lumbar region: Secondary | ICD-10-CM | POA: Diagnosis not present

## 2018-02-04 DIAGNOSIS — H40013 Open angle with borderline findings, low risk, bilateral: Secondary | ICD-10-CM | POA: Diagnosis not present

## 2018-02-25 DIAGNOSIS — E559 Vitamin D deficiency, unspecified: Secondary | ICD-10-CM | POA: Diagnosis not present

## 2018-02-25 DIAGNOSIS — E039 Hypothyroidism, unspecified: Secondary | ICD-10-CM | POA: Diagnosis not present

## 2018-02-25 DIAGNOSIS — E1142 Type 2 diabetes mellitus with diabetic polyneuropathy: Secondary | ICD-10-CM | POA: Diagnosis not present

## 2018-02-25 DIAGNOSIS — E1169 Type 2 diabetes mellitus with other specified complication: Secondary | ICD-10-CM | POA: Diagnosis not present

## 2018-03-04 DIAGNOSIS — E876 Hypokalemia: Secondary | ICD-10-CM | POA: Diagnosis not present

## 2018-03-04 DIAGNOSIS — E1142 Type 2 diabetes mellitus with diabetic polyneuropathy: Secondary | ICD-10-CM | POA: Diagnosis not present

## 2018-03-04 DIAGNOSIS — E785 Hyperlipidemia, unspecified: Secondary | ICD-10-CM | POA: Diagnosis not present

## 2018-03-04 DIAGNOSIS — E1169 Type 2 diabetes mellitus with other specified complication: Secondary | ICD-10-CM | POA: Diagnosis not present

## 2018-03-04 DIAGNOSIS — Z7189 Other specified counseling: Secondary | ICD-10-CM | POA: Diagnosis not present

## 2018-04-08 DIAGNOSIS — R109 Unspecified abdominal pain: Secondary | ICD-10-CM | POA: Diagnosis not present

## 2018-04-08 DIAGNOSIS — E119 Type 2 diabetes mellitus without complications: Secondary | ICD-10-CM | POA: Diagnosis not present

## 2018-04-08 DIAGNOSIS — K219 Gastro-esophageal reflux disease without esophagitis: Secondary | ICD-10-CM | POA: Diagnosis not present

## 2018-04-08 DIAGNOSIS — J449 Chronic obstructive pulmonary disease, unspecified: Secondary | ICD-10-CM | POA: Diagnosis not present

## 2018-04-08 DIAGNOSIS — G629 Polyneuropathy, unspecified: Secondary | ICD-10-CM | POA: Diagnosis not present

## 2018-04-08 DIAGNOSIS — Z6822 Body mass index (BMI) 22.0-22.9, adult: Secondary | ICD-10-CM | POA: Diagnosis not present

## 2018-04-08 DIAGNOSIS — I700 Atherosclerosis of aorta: Secondary | ICD-10-CM | POA: Diagnosis not present

## 2018-04-08 DIAGNOSIS — Z87891 Personal history of nicotine dependence: Secondary | ICD-10-CM | POA: Diagnosis not present

## 2018-04-08 DIAGNOSIS — I10 Essential (primary) hypertension: Secondary | ICD-10-CM | POA: Diagnosis not present

## 2018-04-08 DIAGNOSIS — K3580 Unspecified acute appendicitis: Secondary | ICD-10-CM | POA: Diagnosis not present

## 2018-04-09 DIAGNOSIS — K3580 Unspecified acute appendicitis: Secondary | ICD-10-CM | POA: Diagnosis not present

## 2018-04-09 DIAGNOSIS — K219 Gastro-esophageal reflux disease without esophagitis: Secondary | ICD-10-CM | POA: Diagnosis not present

## 2018-04-09 DIAGNOSIS — I10 Essential (primary) hypertension: Secondary | ICD-10-CM | POA: Diagnosis not present

## 2018-04-21 DIAGNOSIS — M47816 Spondylosis without myelopathy or radiculopathy, lumbar region: Secondary | ICD-10-CM | POA: Diagnosis not present

## 2018-04-21 DIAGNOSIS — Z79891 Long term (current) use of opiate analgesic: Secondary | ICD-10-CM | POA: Diagnosis not present

## 2018-04-21 DIAGNOSIS — M5136 Other intervertebral disc degeneration, lumbar region: Secondary | ICD-10-CM | POA: Diagnosis not present

## 2018-04-21 DIAGNOSIS — M533 Sacrococcygeal disorders, not elsewhere classified: Secondary | ICD-10-CM | POA: Diagnosis not present

## 2018-04-21 DIAGNOSIS — Z87891 Personal history of nicotine dependence: Secondary | ICD-10-CM | POA: Diagnosis not present

## 2018-04-21 DIAGNOSIS — G894 Chronic pain syndrome: Secondary | ICD-10-CM | POA: Diagnosis not present

## 2018-04-21 DIAGNOSIS — M25551 Pain in right hip: Secondary | ICD-10-CM | POA: Diagnosis not present

## 2018-04-21 DIAGNOSIS — M5416 Radiculopathy, lumbar region: Secondary | ICD-10-CM | POA: Diagnosis not present

## 2018-06-29 DIAGNOSIS — E1169 Type 2 diabetes mellitus with other specified complication: Secondary | ICD-10-CM | POA: Diagnosis not present

## 2018-07-06 DIAGNOSIS — E1142 Type 2 diabetes mellitus with diabetic polyneuropathy: Secondary | ICD-10-CM | POA: Diagnosis not present

## 2018-07-06 DIAGNOSIS — E1169 Type 2 diabetes mellitus with other specified complication: Secondary | ICD-10-CM | POA: Diagnosis not present

## 2018-07-06 DIAGNOSIS — E785 Hyperlipidemia, unspecified: Secondary | ICD-10-CM | POA: Diagnosis not present

## 2018-07-06 DIAGNOSIS — E1129 Type 2 diabetes mellitus with other diabetic kidney complication: Secondary | ICD-10-CM | POA: Diagnosis not present

## 2018-07-21 DIAGNOSIS — M5136 Other intervertebral disc degeneration, lumbar region: Secondary | ICD-10-CM | POA: Diagnosis not present

## 2018-07-21 DIAGNOSIS — M5416 Radiculopathy, lumbar region: Secondary | ICD-10-CM | POA: Diagnosis not present

## 2018-07-21 DIAGNOSIS — M47816 Spondylosis without myelopathy or radiculopathy, lumbar region: Secondary | ICD-10-CM | POA: Diagnosis not present

## 2018-07-21 DIAGNOSIS — G894 Chronic pain syndrome: Secondary | ICD-10-CM | POA: Diagnosis not present

## 2018-07-21 DIAGNOSIS — M25551 Pain in right hip: Secondary | ICD-10-CM | POA: Diagnosis not present

## 2018-07-23 DIAGNOSIS — E1142 Type 2 diabetes mellitus with diabetic polyneuropathy: Secondary | ICD-10-CM | POA: Diagnosis not present

## 2018-07-23 DIAGNOSIS — E1129 Type 2 diabetes mellitus with other diabetic kidney complication: Secondary | ICD-10-CM | POA: Diagnosis not present

## 2018-07-23 DIAGNOSIS — E1169 Type 2 diabetes mellitus with other specified complication: Secondary | ICD-10-CM | POA: Diagnosis not present

## 2018-07-23 DIAGNOSIS — E785 Hyperlipidemia, unspecified: Secondary | ICD-10-CM | POA: Diagnosis not present

## 2018-07-29 DIAGNOSIS — Z139 Encounter for screening, unspecified: Secondary | ICD-10-CM | POA: Diagnosis not present

## 2018-07-29 DIAGNOSIS — Z0000 Encounter for general adult medical examination without abnormal findings: Secondary | ICD-10-CM | POA: Diagnosis not present

## 2018-08-03 DIAGNOSIS — Z1212 Encounter for screening for malignant neoplasm of rectum: Secondary | ICD-10-CM | POA: Diagnosis not present

## 2018-08-03 DIAGNOSIS — Z1211 Encounter for screening for malignant neoplasm of colon: Secondary | ICD-10-CM | POA: Diagnosis not present

## 2018-08-21 DIAGNOSIS — E1169 Type 2 diabetes mellitus with other specified complication: Secondary | ICD-10-CM | POA: Diagnosis not present

## 2018-08-21 DIAGNOSIS — E785 Hyperlipidemia, unspecified: Secondary | ICD-10-CM | POA: Diagnosis not present

## 2018-08-21 DIAGNOSIS — E1142 Type 2 diabetes mellitus with diabetic polyneuropathy: Secondary | ICD-10-CM | POA: Diagnosis not present

## 2018-08-21 DIAGNOSIS — E1129 Type 2 diabetes mellitus with other diabetic kidney complication: Secondary | ICD-10-CM | POA: Diagnosis not present

## 2018-09-29 ENCOUNTER — Other Ambulatory Visit: Payer: Self-pay

## 2018-09-29 NOTE — Patient Outreach (Signed)
[facility]) Care Management  09/29/2018  Michelle Perez July 19, 1955 [id number]   Medication Adherence call to Michelle Perez [facility] spoke with patient she is past due on Rosuvastatin 5 mg patient explain she is only taking 1 tablet every other day because she is having side effects(leg Cramps) patient will go over with doctor on her next appointment patient has plenty at this time.Michelle Perez is showing past due under [facility].   [facility] Management Direct Dial [phone]  Fax [phone] [email or web address]

## 2018-10-15 DIAGNOSIS — M5136 Other intervertebral disc degeneration, lumbar region: Secondary | ICD-10-CM | POA: Diagnosis not present

## 2018-10-15 DIAGNOSIS — M5416 Radiculopathy, lumbar region: Secondary | ICD-10-CM | POA: Diagnosis not present

## 2018-10-15 DIAGNOSIS — G894 Chronic pain syndrome: Secondary | ICD-10-CM | POA: Diagnosis not present

## 2018-10-15 DIAGNOSIS — Z87891 Personal history of nicotine dependence: Secondary | ICD-10-CM | POA: Diagnosis not present

## 2018-10-15 DIAGNOSIS — M47816 Spondylosis without myelopathy or radiculopathy, lumbar region: Secondary | ICD-10-CM | POA: Diagnosis not present

## 2018-10-15 DIAGNOSIS — M533 Sacrococcygeal disorders, not elsewhere classified: Secondary | ICD-10-CM | POA: Diagnosis not present

## 2018-10-15 DIAGNOSIS — M25551 Pain in right hip: Secondary | ICD-10-CM | POA: Diagnosis not present

## 2018-10-16 DIAGNOSIS — I951 Orthostatic hypotension: Secondary | ICD-10-CM | POA: Diagnosis not present

## 2018-10-16 DIAGNOSIS — Z6822 Body mass index (BMI) 22.0-22.9, adult: Secondary | ICD-10-CM | POA: Diagnosis not present

## 2018-10-23 DIAGNOSIS — E1129 Type 2 diabetes mellitus with other diabetic kidney complication: Secondary | ICD-10-CM | POA: Diagnosis not present

## 2018-10-23 DIAGNOSIS — E1169 Type 2 diabetes mellitus with other specified complication: Secondary | ICD-10-CM | POA: Diagnosis not present

## 2018-10-23 DIAGNOSIS — E785 Hyperlipidemia, unspecified: Secondary | ICD-10-CM | POA: Diagnosis not present

## 2018-10-23 DIAGNOSIS — E1142 Type 2 diabetes mellitus with diabetic polyneuropathy: Secondary | ICD-10-CM | POA: Diagnosis not present

## 2018-10-29 DIAGNOSIS — E1129 Type 2 diabetes mellitus with other diabetic kidney complication: Secondary | ICD-10-CM | POA: Diagnosis not present

## 2018-10-29 DIAGNOSIS — E1169 Type 2 diabetes mellitus with other specified complication: Secondary | ICD-10-CM | POA: Diagnosis not present

## 2018-11-23 DIAGNOSIS — E785 Hyperlipidemia, unspecified: Secondary | ICD-10-CM | POA: Diagnosis not present

## 2018-11-23 DIAGNOSIS — E1129 Type 2 diabetes mellitus with other diabetic kidney complication: Secondary | ICD-10-CM | POA: Diagnosis not present

## 2018-11-23 DIAGNOSIS — E1169 Type 2 diabetes mellitus with other specified complication: Secondary | ICD-10-CM | POA: Diagnosis not present

## 2018-11-23 DIAGNOSIS — E1142 Type 2 diabetes mellitus with diabetic polyneuropathy: Secondary | ICD-10-CM | POA: Diagnosis not present

## 2018-12-04 DIAGNOSIS — K222 Esophageal obstruction: Secondary | ICD-10-CM | POA: Diagnosis not present

## 2018-12-04 DIAGNOSIS — E1169 Type 2 diabetes mellitus with other specified complication: Secondary | ICD-10-CM | POA: Diagnosis not present

## 2018-12-04 DIAGNOSIS — E1129 Type 2 diabetes mellitus with other diabetic kidney complication: Secondary | ICD-10-CM | POA: Diagnosis not present

## 2018-12-04 DIAGNOSIS — E785 Hyperlipidemia, unspecified: Secondary | ICD-10-CM | POA: Diagnosis not present

## 2018-12-23 DIAGNOSIS — E1142 Type 2 diabetes mellitus with diabetic polyneuropathy: Secondary | ICD-10-CM | POA: Diagnosis not present

## 2018-12-23 DIAGNOSIS — E1129 Type 2 diabetes mellitus with other diabetic kidney complication: Secondary | ICD-10-CM | POA: Diagnosis not present

## 2018-12-23 DIAGNOSIS — E785 Hyperlipidemia, unspecified: Secondary | ICD-10-CM | POA: Diagnosis not present

## 2018-12-23 DIAGNOSIS — E1169 Type 2 diabetes mellitus with other specified complication: Secondary | ICD-10-CM | POA: Diagnosis not present

## 2021-03-29 DIAGNOSIS — I214 Non-ST elevation (NSTEMI) myocardial infarction: Secondary | ICD-10-CM

## 2021-03-29 DIAGNOSIS — I2510 Atherosclerotic heart disease of native coronary artery without angina pectoris: Secondary | ICD-10-CM

## 2021-03-29 HISTORY — PX: LEFT HEART CATH AND CORONARY ANGIOGRAPHY: CATH118249

## 2021-03-29 HISTORY — DX: Non-ST elevation (NSTEMI) myocardial infarction: I21.4

## 2021-03-29 HISTORY — DX: Atherosclerotic heart disease of native coronary artery without angina pectoris: I25.10

## 2021-03-29 HISTORY — PX: CORONARY STENT INTERVENTION: CATH118234

## 2021-04-20 ENCOUNTER — Ambulatory Visit: Payer: Self-pay | Admitting: Cardiology

## 2021-05-04 ENCOUNTER — Other Ambulatory Visit: Payer: Self-pay

## 2021-05-04 ENCOUNTER — Ambulatory Visit (INDEPENDENT_AMBULATORY_CARE_PROVIDER_SITE_OTHER): Payer: Medicare Other | Admitting: Cardiology

## 2021-05-04 ENCOUNTER — Telehealth: Payer: Self-pay | Admitting: Cardiology

## 2021-05-04 ENCOUNTER — Encounter: Payer: Self-pay | Admitting: Cardiology

## 2021-05-04 ENCOUNTER — Ambulatory Visit (INDEPENDENT_AMBULATORY_CARE_PROVIDER_SITE_OTHER): Payer: Medicare Other

## 2021-05-04 ENCOUNTER — Other Ambulatory Visit: Payer: Self-pay | Admitting: Cardiology

## 2021-05-04 VITALS — BP 130/60 | HR 91 | Ht 62.0 in | Wt 127.2 lb

## 2021-05-04 DIAGNOSIS — I2510 Atherosclerotic heart disease of native coronary artery without angina pectoris: Secondary | ICD-10-CM | POA: Diagnosis not present

## 2021-05-04 DIAGNOSIS — I498 Other specified cardiac arrhythmias: Secondary | ICD-10-CM

## 2021-05-04 DIAGNOSIS — Z9861 Coronary angioplasty status: Secondary | ICD-10-CM

## 2021-05-04 DIAGNOSIS — E785 Hyperlipidemia, unspecified: Secondary | ICD-10-CM

## 2021-05-04 DIAGNOSIS — I4729 Other ventricular tachycardia: Secondary | ICD-10-CM

## 2021-05-04 DIAGNOSIS — I214 Non-ST elevation (NSTEMI) myocardial infarction: Secondary | ICD-10-CM

## 2021-05-04 DIAGNOSIS — R002 Palpitations: Secondary | ICD-10-CM

## 2021-05-04 DIAGNOSIS — E039 Hypothyroidism, unspecified: Secondary | ICD-10-CM

## 2021-05-04 DIAGNOSIS — I10 Essential (primary) hypertension: Secondary | ICD-10-CM | POA: Diagnosis not present

## 2021-05-04 MED ORDER — COQ-10 100 MG PO CAPS: 300.0000 mg | ORAL_CAPSULE | Freq: Every day | ORAL | 11 refills | Status: AC

## 2021-05-04 MED ORDER — METOPROLOL SUCCINATE ER 50 MG PO TB24: 50.0000 mg | ORAL_TABLET | Freq: Every day | ORAL | 3 refills | Status: AC

## 2021-05-04 MED ORDER — ROSUVASTATIN CALCIUM 10 MG PO TABS: 5.0000 mg | ORAL_TABLET | Freq: Two times a day (BID) | ORAL | Status: DC

## 2021-05-04 MED ORDER — EZETIMIBE 10 MG PO TABS: 10.0000 mg | ORAL_TABLET | Freq: Every day | ORAL | 3 refills | Status: AC

## 2021-05-04 NOTE — Progress Notes (Signed)
Last seen by Verdell Face, MD of [facility])  Michelle Perez is a 66 y.o. with COPD, hypertension, dyslipidemia and family history of cardiac illnesses who recently sustained cardiac arrest (polymorphic VT) requiring stenting to OM presents today to establish care. [place] nearest hospital. She underwent cardiac catheterization which showed multiple nonobstructive CAD including critical OM lesion which got stented. Since discharge patient has done well except short-lived atypical chest discomfort. Her blood pressure is well controlled on current therapy. She has some fatigue and tiredness. She is on appropriate medical therapy at this point and wants a refill.  CAD status post MI along with a cardiac arrest requiring stenting to OM. Cardiac catheterization also showed multiple nonobstructive lesion including 40 to 50% patient in the left main => discharged on aspirin Plavix, Toprol 25 mg daily, rosuvastatin 10 mg daily.  Lasix 20 mg daily, Synthroid 25 mcg. -> Wanted to start working on the left [employer].  Primary Care Provider: Dortha Kern, [place], [place]) Primary [facility] Cardiologist: [place]. Luther Hearing, MD  Past Medical History:  Diagnosis Date   Arthritis   Cancer (*)   Non-STEMI with VT arrest 03/29/2021 -> presented to regional ER within the polymorphic VT arrest requiring defibrillation-ROSC.  Started on amiodarone and transferred to [facility]  COPD (chronic obstructive pulmonary disease) (*)   Diabetes mellitus (*)   Disease of thyroid gland   Elevated cholesterol   GERD (gastroesophageal reflux disease)   Hypertension   Reflux   Vision abnormalities   LHC / PCI 03/29/21 CONCLUSIONS: 1. Normal aortic pressure. 2.  40-50% left main: Evaluated with IVUS-nonobstructive left main coronary disease with borderline angiographic significance and a minimal  luminal area of 6.4 sq mm.  3. Nonobstructive left anterior descending artery disease.  4. 99% OM1 stenosis with 30% to 40% ostial circumflex stenosis confirmed by intravascular ultrasound. =>  Successful percutaneous coronary intervention and drug-eluting stent implantation of OM1 with reduction of stenosis from 99% to 0%. Blood flow improved from TIMI 2 to TIMI 3.  ECHO 03/30/21 1. Normal left ventricular cavity size. Normal left ventricular systolic function. LV Ejection Fraction is approximately: 62 %. 2. Left ventricular diastolic parameters are consistent with normal diastolic function. 3. Resting Segmental Wall Motion Analysis: Total wall motion score is 1.12. There is hypokinesis of the entire inferolateral wall. The remaining left ventricular segments demonstrate normal wall motion. 4. Normal right ventricular size and systolic function. 5. The left atrium is normal in size. 6. Mild mitral valve regurgitation. Mild mitral annular calcification. 7. The aortic valve is sclerotic with adequate leaflet motion. 8. Normal estimated pulmonary artery systolic pressure. 9. The aortic root is normal in size. The ascending aorta is normal in size.   Appendectomy   Colonscopy   Esophagogastroduodenoscopy   Hysterectomy   Joint replacement Right  shoulder   Shoulder open rotator cuff repair Right  x3   Skin cancer excision  nose   Tonsillectomy  Socioeconomic History   Marital status: Married  Spouse name: Not on file   Number of children: Not on file   Years of education: Not on file   Highest education level: Not on file  Occupational History   Not on file  Tobacco Use   Smoking status: Former  Packs/day: 0.50  Years: 40.00  Pack years: 20.00  Types: Cigarettes  Quit date: 08/24/2015  Years since quitting: 5.6  Smokeless tobacco: Never  Vaping Use   Vaping Use: Never used  Substance and Sexual Activity   Alcohol use: No   Drug use: No dysplasia  Allergies  Allergen  Reactions   Actos [Pioglitazone] Swelling   Codeine Hives  Can take liquid form   Contact Dermatitis Rash  TAPE   Methylprednisolone Rash  Pt flushed and was very hot after 2 doses.  This time

## 2021-05-04 NOTE — Telephone Encounter (Signed)
Spoke to patient. She was calling to let Dr. Ellyn Hack know that her husband is on Clopidogrel Bisulfate 75 mg once daily  Her PCP has been updated in epic as well per her request.

## 2021-05-04 NOTE — Patient Instructions (Signed)
Medication Instructions:   Changes rosuvastatin to 5 mg (1/2 tablet of 10 mg)  twice a day   Start taking CoQ10 300 mg  once a daily   Increase Metoprolol succinate ( Toprol XL )  50 mg  one week after starting this medication  Start taking Zetia 10 mg one tablet daily\ *If you need a refill on your cardiac medications before your next appointment, please call your pharmacy*   Lab Work: In 2 months ( April 2023)  CMP LIPID If you have labs (blood work) drawn today and your tests are completely normal, you will receive your results only by: [place] (if you have MyChart) OR A paper copy in the mail If you have any lab test that is abnormal or we need to change your treatment, we will call you to review the results.   Testing/Procedures:  Will be mailed to your home  3 to 5 days Your physician has recommended that you wear a holter monitor 7 days ZIO . Holter monitors are medical devices that record the hearts electrical activity. Doctors most often use these monitors to diagnose arrhythmias. Arrhythmias are problems with the speed or rhythm of the heartbeat. The monitor is a small, portable device. You can wear one while you do your normal daily activities. This is usually used to diagnose what is causing palpitations/syncope (passing out).    Follow-Up: At [facility], you and your health needs are our priority.  As part of our continuing mission to provide you with exceptional heart care, we have created designated Provider Care Teams.  These Care Teams include your primary Cardiologist (physician) and Advanced Practice Providers (APPs -  Physician Assistants and Nurse Practitioners) who all work together to provide you with the care you need, when you need it.  We recommend signing up for the patient portal called "MyChart".  Sign up information is provided on this After Visit Summary.  MyChart is used to connect with patients for Virtual Visits (Telemedicine).  Patients are  able to view lab/test results, encounter notes, upcoming appointments, etc.  Non-urgent messages can be sent to your provider as well.   To learn more about what you can do with MyChart, go to [email or web address].    Your next appointment:   4 month(s)  The format for your next appointment:   In Person  Provider:   Glenetta Hew, MD     Other Instructions ZIO XT- Long Term Monitor Instructions  Your physician has requested you wear a ZIO patch monitor for 7 days.  This is a single patch monitor. [employer] supplies one patch monitor per enrollment. Additional stickers are not available. Please do not apply patch if you will be having a Nuclear Stress Test,  Echocardiogram, Cardiac CT, MRI, or Chest Xray during the period you would be wearing the  monitor. The patch cannot be worn during these tests. You cannot remove and re-apply the  ZIO XT patch monitor.  Your ZIO patch monitor will be mailed 3 day USPS to your address on file. It may take 3-5 days  to receive your monitor after you have been enrolled.  Once you have received your monitor, please review the enclosed instructions. Your monitor  has already been registered assigning a specific monitor serial # to you.  Billing and Patient Assistance Program Information  We have supplied [employer] with any of your insurance information on file for billing purposes. [employer] offers a sliding scale Patient Assistance Program for patients that do not  have  insurance, or whose insurance does not completely cover the cost of the ZIO monitor.  You must apply for the Patient Assistance Program to qualify for this discounted rate.  To apply, please call [employer] at [phone], select option 4, select option 2, ask to apply for  Patient Assistance Program. Theodore Demark will ask your household income, and how many people  are in your household. They will quote your out-of-pocket cost based on that information.  [employer] will also be able to set up  a 20-month, interest-free payment plan if needed.  Applying the monitor   Shave hair from upper left chest.  Hold abrader disc by orange tab. Rub abrader in 40 strokes over the upper left chest as  indicated in your monitor instructions.  Clean area with 4 enclosed alcohol pads. Let dry.  Apply patch as indicated in monitor instructions. Patch will be placed under collarbone on left  side of chest with arrow pointing upward.  Rub patch adhesive wings for 2 minutes. Remove white label marked "1". Remove the white  label marked "2". Rub patch adhesive wings for 2 additional minutes.  While looking in a mirror, press and release button in center of patch. A small green light will  flash 3-4 times. This will be your only indicator that the monitor has been turned on.  Do not shower for the first 24 hours. You may shower after the first 24 hours.  Press the button if you feel a symptom. You will hear a small click. Record Date, Time and  Symptom in the Patient Logbook.  When you are ready to remove the patch, follow instructions on the last 2 pages of Patient  Logbook. Stick patch monitor onto the last page of Patient Logbook.  Place Patient Logbook in the blue and white box. Use locking tab on box and tape box closed  securely. The blue and white box has prepaid postage on it. Please place it in the mailbox as  soon as possible. Your physician should have your test results approximately 7 days after the  monitor has been mailed back to [facility].  Call [place] at [phone] if you have questions regarding  your ZIO XT patch monitor. Call them immediately if you see an orange light blinking on your  monitor.  If your monitor falls off in less than 4 days, contact our Monitor department at [phone].  If your monitor becomes loose or falls off after 4 days call [employer] at [phone] for  suggestions on securing your monitor

## 2021-05-04 NOTE — Progress Notes (Unsigned)
Enrolled for [employer] to mail a ZIO XT long term holter monitor to the patients address on file.  

## 2021-05-04 NOTE — Telephone Encounter (Signed)
Pt c/o medication issue:  1. Name of Medication: Clopidogrel Bisulfate 75 MG   2. How are you currently taking this medication (dosage and times per day)? Not currently taking   3. Are you having a reaction (difficulty breathing--STAT)? No   4. What is your medication issue? Patient states Dr. Ellyn Hack advised her to call and let us know which plavix she can take. Cannot take the one on her current medication list.

## 2021-05-04 NOTE — Progress Notes (Signed)
Primary Care Provider: [facility] Cardiologist: Glenetta Hew, MD Electrophysiologist: None  Clinic Note: No chief complaint on file.  ===================================  ASSESSMENT/PLAN   Problem List Items Addressed This Visit       Cardiology Problems   Non-ST elevation (NSTEMI) myocardial infarction [facility])   Relevant Medications   nitroGLYCERIN (NITROSTAT) 0.4 MG SL tablet   aspirin 81 MG EC tablet   furosemide (LASIX) 20 MG tablet   metoprolol succinate (TOPROL-XL) 50 MG 24 hr tablet   rosuvastatin (CRESTOR) 10 MG tablet   ezetimibe (ZETIA) 10 MG tablet   Other Relevant Orders   EKG 12-Lead   Lipid panel   Comprehensive metabolic panel   CAD S/P percutaneous coronary angioplasty - Primary (Chronic)    DES PCI to the OM1 with a 2.5 mm x 60 mm Synergy stent.  On aspirin and Plavix with plan DAPT x1 year.  No bleeding issues at this point.  On max tolerable dose of statin along with beta-blocker which I am increasing to 50 mg daily.  Not currently on ACE inhibitor or ARB, with preserved EF, will hold off and see what blood pressure looks like in follow-up.      Relevant Medications   nitroGLYCERIN (NITROSTAT) 0.4 MG SL tablet   aspirin 81 MG EC tablet   furosemide (LASIX) 20 MG tablet   metoprolol succinate (TOPROL-XL) 50 MG 24 hr tablet   rosuvastatin (CRESTOR) 10 MG tablet   ezetimibe (ZETIA) 10 MG tablet   Other Relevant Orders   EKG 12-Lead   Lipid panel   Comprehensive metabolic panel   Polymorphic ventricular tachycardia (Chronic)    No further episodes that I can tell, but I wonder if these short-lived spells that she is having where she describes feeling her chest "shutting down" could be brief burst of arrhythmia.  Plan: Increase Toprol to 50 mg daily and check 7-day Zio patch monitor        Relevant Medications   nitroGLYCERIN (NITROSTAT) 0.4 MG SL tablet   aspirin 81 MG EC tablet   furosemide (LASIX) 20 MG tablet    metoprolol succinate (TOPROL-XL) 50 MG 24 hr tablet   rosuvastatin (CRESTOR) 10 MG tablet   ezetimibe (ZETIA) 10 MG tablet   Other Relevant Orders   EKG 12-Lead   Lipid panel   Comprehensive metabolic panel   Essential hypertension (Chronic)    Blood pressure stable today on current dose of Toprol, but with concern for possible palpitations and PMVT, will increase Toprol to 50 mg daily.      Relevant Medications   nitroGLYCERIN (NITROSTAT) 0.4 MG SL tablet   aspirin 81 MG EC tablet   furosemide (LASIX) 20 MG tablet   metoprolol succinate (TOPROL-XL) 50 MG 24 hr tablet   rosuvastatin (CRESTOR) 10 MG tablet   ezetimibe (ZETIA) 10 MG tablet   Other Relevant Orders   Lipid panel   Comprehensive metabolic panel   Hyperlipidemia with target LDL less than 70 (Chronic)    Lipids reviewed.  Not quite at goal.  We will add Zetia 10 mg daily to her current dose of Crestor.  She would not able to tolerate higher doses, and is having a hard time with the 10 mg Crestor right now.  I told her to take 5 mg twice daily to see if she can tolerate.  May need to refer to CVRR for PCSK9 inhibitor.      Relevant Medications   nitroGLYCERIN (NITROSTAT) 0.4 MG SL  tablet   aspirin 81 MG EC tablet   furosemide (LASIX) 20 MG tablet   metoprolol succinate (TOPROL-XL) 50 MG 24 hr tablet   rosuvastatin (CRESTOR) 10 MG tablet   ezetimibe (ZETIA) 10 MG tablet   Other Relevant Orders   EKG 12-Lead   Lipid panel   Comprehensive metabolic panel     Other   Hypothyroidism (acquired)   Relevant Medications   levothyroxine (SYNTHROID) 25 MCG tablet   metoprolol succinate (TOPROL-XL) 50 MG 24 hr tablet   Other Relevant Orders   EKG 12-Lead   Lipid panel   Comprehensive metabolic panel   Periodic heart flutter (Chronic)    Period of heart fluttering sensations: Check Zio patch monitor.  Need to exclude significant arrhythmia based on her having history of polymorphic VT. (PMVT)  Check Zio patch monitor,  increase Toprol to 50 mg daily.      Relevant Orders   EKG 12-Lead   Lipid panel   Comprehensive metabolic panel    ===================================  HPI:    Michelle Perez is a 66 y.o. female with a recent MI-PCI along with HTN, HLD is being seen today TO TRANSFER CARDIOLOGY CARE post MI at the request of Jola Baptist, PA-C.  Recent Hospitalizations:  [facility] (03/29/2021): Admitted with non-STEMI-outside hospital [place]--> shortly after initiation of evaluation, she suffered cardiac arrest (primary VT-defib x1, IV amiodarone).  Transferred to [facility], found to have 99% OM1 lesion with IVUS confirmed moderate left main and ostial LCx disease.  Preserved EF on echo:  Discharged on Aspirin & 6 6 EF of the DES lately Reported yes echo report Discharge on the echo report we will keep this to [place] intertrigo Plavix, Toprol 25 mg daily, rosuvastatin 10 mg daily.  Lasix 20 mg daily, Synthroid 25 mcg.  Ota Ebersole was just seen on January 19 for hospital follow-up by Dr. Gerarda Gunther from [facility] in [place].  She had done well since discharge with exception of 1 episode of short-lived chest discomfort.  BP controlled.  Had some fatigue.  Noted to be on appropriate medications which were refilled.  Reviewed  CV studies:    The following studies were reviewed today: (if available, images/films reviewed: From Epic Chart or Care Everywhere) LHC / PCI 03/29/21 1. Normal aortic pressure. 2.  40-50% left main: Evaluated with IVUS-nonobstructive left main coronary disease with borderline angiographic significance and a minimal luminal area of 6.4 sq mm.  3. Nonobstructive left anterior descending artery disease.  4. 99% OM1 stenosis with 30% to 40% ostial circumflex stenosis confirmed by intravascular ultrasound. =>  Successful percutaneous coronary intervention and drug-eluting stent implantation of OM1 with reduction of stenosis  from 99% to 0%. Blood flow improved from TIMI 2 to TIMI 3.  ECHO 03/30/21 1. Normal left ventricular cavity size. Normal left ventricular systolic function. LV Ejection Fraction is approximately: 62 %. 2. Left ventricular diastolic parameters are consistent with normal diastolic function. 3. Resting Segmental Wall Motion Analysis: Total wall motion score is 1.12. There is hypokinesis of the entire inferolateral wall. The remaining left ventricular segments demonstrate normal wall motion. 4. Normal right ventricular size and systolic function. 5. The left atrium is normal in size. 6. Mild mitral valve regurgitation. Mild mitral annular calcification. 7. The aortic valve is sclerotic with adequate leaflet motion. 8. Normal estimated pulmonary artery systolic pressure. 9. The aortic root is normal in size. The ascending aorta is normal in size  Interval History:   Starlene Consuegra presents here today several weeks after being seen by Dr. Gerarda Gunther to transfer cardiology care.  She says that she has some anxiety about her heart attack and had multiple questions about existing disease etc.  She is accompanied by her husband who also has multiple questions.  They describe the incident that occurred leading to her heart attack.  They were down in [place], [place] camping in a camper.  They were loading up to drive home when she started having chest discomfort that would not go away.  They were able to find a local stand-alone ER.  Upon initial evaluation there, she actually went to VT arrest requiring defibrillation.  She was then transferred to [facility] for PCI and did well after that.  Since discharge, she has had intermittent little "niggling pains" lasting a few seconds here and there but has also been had some spells lasting a few minutes not exertional.  She describes it more as a fluttering like sensation where her chest "shuts down".  These can happen several times a day or can not happen at  all.  She actually has been quite active with exercise now walking back and forth to the mailbox (about 300 feet from the house).  She is not having any exertional symptoms of chest pain or pressure.  No dyspnea.  No PND, orthopnea or edema.  No syncope or near syncope, TIA or BX. No more true anginal type chest pain.  REVIEWED OF SYSTEMS   Review of Systems  Constitutional:  Negative for malaise/fatigue and weight loss.  HENT:  Negative for nosebleeds.   Respiratory:  Negative for cough and shortness of breath.   Cardiovascular:        Per HPI  Gastrointestinal:  Negative for blood in stool and melena.  Genitourinary:  Negative for hematuria.  Musculoskeletal:  Negative for joint pain and myalgias.  Neurological:  Positive for dizziness (Sometimes if she stands up quickly). Negative for focal weakness and weakness.  Psychiatric/Behavioral:  Negative for depression and memory loss. The patient is nervous/anxious. The patient does not have insomnia.    I have reviewed and (if needed) personally updated the patient's problem list, medications, allergies, past medical and surgical history, social and family history.   PAST MEDICAL HISTORY   Past Medical History:  Diagnosis Date   Arthritis    Cancer ([place])    skin cancer - nose   COPD (chronic obstructive pulmonary disease) ([place])    Coronary artery disease involving native coronary artery of native heart 03/29/2021   Non-STEMI with VT arrest: 99% OM1-DES PCI, ostial LCx 30 to 40%.  Left main 40 to 50% nonocclusive by IVUS (MLA 6.4 mm) nonobstructive LAD.   Diabetes mellitus without complication (HCC)    type 2 , diet and exercise controlled   GERD (gastroesophageal reflux disease)    H/O hiatal hernia    Hyperlipemia    Non-STEMI (non-ST elevated myocardial infarction) ([place]) 98/92/1194   Complicated by polymorphic V. tach -> occurred in the ER, defibrillated x1.:  99% OM1-PCI    PAST SURGICAL HISTORY   Past Surgical History:   Procedure Laterality Date   ABDOMINAL HYSTERECTOMY  03/25/1982   CARDIOVASCULAR STRESS TEST  11/29/2011   poor image quality, no evidence of ischemia in well visualized segments, inferior lateral wall impossible to evaluate secondary to significant GI artifiact, preserved EF 56% [facility])   [place]  03/29/2021   [facility]: DES  PCI-OM1 (full report not available)   LEFT HEART CATH AND CORONARY ANGIOGRAPHY  03/29/2021   [facility]: Normal aortic pressure.  40 to 50% LEFT MAIN (IVUS MLA 6.4 mm).  30 to 40% ostial LCx and 30% ostial LAD.  99% OM1-DES PCI.  30 to 40% ostial LCx also evaluated by IVUS.   REVERSE SHOULDER ARTHROPLASTY Right 04/01/2013   Procedure: REVERSE SHOULDER ARTHROPLASTY;  Surgeon: Nita Sells, MD;  Location: [place];  Service: Orthopedics;  Laterality: Right;   ROTATOR CUFF REPAIR Right    x3   SKIN CANCER EXCISION     nose and back   TONSILLECTOMY     and adenoids age 17   TRANSTHORACIC ECHOCARDIOGRAM     Normal EF 60 to 65%.  Hypokinesis of the entire inferolateral wall..  Diastolic function noted.  Mild MR with mild MAC.  AOV sclerosis but no stenosis.     There is no immunization history on file for this patient.  MEDICATIONS/ALLERGIES   Current Meds  Medication Sig   aspirin 81 MG EC tablet Take by mouth.   baclofen (LIORESAL) 10 MG tablet Take by mouth.   clopidogrel (PLAVIX) 75 MG tablet Take by mouth.   Coenzyme Q10 (COQ-10) 100 MG CAPS Take 300 mg by mouth daily.   dexlansoprazole (DEXILANT) 60 MG capsule Take 1 capsule by mouth daily.   estrogens, conjugated, (PREMARIN) 0.3 MG tablet Take 0.3 mg by mouth daily. Take daily for 21 days then do not take for 7 days.   ezetimibe (ZETIA) 10 MG tablet Take 1 tablet (10 mg total) by mouth daily.   furosemide (LASIX) 20 MG tablet Take 20 mg by mouth daily.   levothyroxine (SYNTHROID) 25 MCG tablet Take 25 mcg by mouth every morning.   metoprolol  succinate (TOPROL-XL) 50 MG 24 hr tablet Take 1 tablet (50 mg total) by mouth daily. Take with or immediately following a meal.   Multiple Vitamins-Minerals (CENTRUM SILVER 50+WOMEN) TABS Take by mouth.   nitroGLYCERIN (NITROSTAT) 0.4 MG SL tablet Place under the tongue.   potassium chloride (MICRO-K) 10 MEQ CR capsule Take 10 mEq by mouth daily.   rizatriptan (MAXALT-MLT) 10 MG disintegrating tablet Take by mouth.   tapentadol (NUCYNTA ER) 100 MG 12 hr tablet Take 100 mg by mouth every 12 (twelve) hours. Take 1 tablet by mouth twice a day.   tapentadol (NUCYNTA) 50 MG tablet Take 50 mg by mouth. Take 1 tablet by mouth daily as needed .   [DISCONTINUED] metoprolol succinate (TOPROL-XL) 25 MG 24 hr tablet Take 25 mg by mouth daily.   [DISCONTINUED] rosuvastatin (CRESTOR) 10 MG tablet Take 10 mg by mouth daily.    Allergies  Allergen Reactions   Codeine Hives    vicodin is okay   Tape Rash    SOCIAL HISTORY/FAMILY HISTORY   Reviewed in Epic:   Social History   Tobacco Use   Smoking status: Every Day    Packs/day: 0.60    Years: 43.00    Pack years: 25.80    Types: Cigarettes  Substance Use Topics   Alcohol use: No   Drug use: No   Social History   Social History Narrative   Not on file   Family History  Problem Relation Age of Onset   Coronary artery disease Mother 32   Heart attack Mother 33   Heart attack Brother 18       Several heart attacks   Coronary artery disease Brother  Congestive Heart Failure Brother     OBJCTIVE -PE, EKG, labs   Wt Readings from Last 3 Encounters:  05/04/21 127 lb 3.2 oz (57.7 kg)  03/26/13 144 lb 11.2 oz (65.6 kg)    Physical Exam: BP 130/60    Pulse 91    Ht _0  (1.575 m)    Wt 127 lb 3.2 oz (57.7 kg)    SpO2 98%    BMI 23.27 kg/m  Physical Exam Vitals reviewed.  Constitutional:      General: She is not in acute distress.    Appearance: Normal appearance. She is not ill-appearing or toxic-appearing.  HENT:     Head:  Normocephalic and atraumatic.  Neck:     Vascular: No carotid bruit or JVD.  Cardiovascular:     Rate and Rhythm: Normal rate and regular rhythm. Occasional Extrasystoles are present.    Chest Wall: PMI is not displaced.     Pulses: Normal pulses.     Heart sounds: Normal heart sounds, S1 normal and S2 normal. No murmur heard.   No friction rub. No gallop.  Pulmonary:     Effort: Pulmonary effort is normal. No respiratory distress.     Breath sounds: Normal breath sounds. No wheezing, rhonchi or rales.  Chest:     Chest wall: No tenderness.  Abdominal:     General: Abdomen is flat. Bowel sounds are normal. There is no distension.     Palpations: Abdomen is soft. There is no mass (No HSM or bruit).     Tenderness: There is no abdominal tenderness.  Musculoskeletal:        General: No swelling. Normal range of motion.     Cervical back: Normal range of motion.  Skin:    General: Skin is warm and dry.  Neurological:     General: No focal deficit present.     Mental Status: She is alert and oriented to person, place, and time.     Cranial Nerves: No cranial nerve deficit.     Gait: Gait normal.  Psychiatric:        Mood and Affect: Mood normal.        Behavior: Behavior normal.        Thought Content: Thought content normal.        Judgment: Judgment normal.     Adult ECG Report  Rate: 91;  Rhythm: normal sinus rhythm and normal axis, intervals and durations.  Left atrial enlargement  ;   Narrative Interpretation: Relatively normal EKG.  Recent Labs: Reviewed in [facility] 03/30/2021: TC 159, TG 119, HDL 50.6, LDL 84.6; AST 55 ALT 21, alk phos 71.  Cr 0.73, K+ 3.9  CBC Latest Ref Rng & Units 04/02/2013 03/26/2013  WBC 4.0 - 10.5 K/uL 8.8 9.7  Hemoglobin 12.0 - 15.0 g/dL 10.7(L) 14.3  Hematocrit 36.0 - 46.0 % 31.2(L) 42.5  Platelets 150 - 400 K/uL 186 264    No results found for: HGBA1C No results found for:  TSH  ==================================================  COVID-19 Education: The signs and symptoms of COVID-19 were discussed with the patient and how to seek care for testing (follow up with PCP or arrange E-visit).    I spent a total of 45 minutes with the patient spent in direct patient consultation.  -> Questions asked and answered.  Reviewed report and echo with the patient. Additional time spent with chart review  / charting (studies, outside notes, etc): 40 min -> H&P, discharge summary, Report, echo report and  clinic notes reviewed.  Medical history updated. Total Time: 85 min  Current medicines are reviewed at length with the patient today.  (+/- concerns) n/a  This visit occurred during the SARS-CoV-2 public health emergency.  Safety protocols were in place, including screening questions prior to the visit, additional usage of staff PPE, and extensive cleaning of exam room while observing appropriate contact time as indicated for disinfecting solutions.  Notice: This dictation was prepared with Dragon dictation along with smart phrase technology. Any transcriptional errors that result from this process are unintentional and may not be corrected upon review.   Studies Ordered:  Orders Placed This Encounter  Procedures   Lipid panel   Comprehensive metabolic panel   EKG 37-CWUG    Patient Instructions / Medication Changes & Studies & Tests Ordered   Patient Instructions  Medication Instructions:   Changes rosuvastatin to 5 mg (1/2 tablet of 10 mg)  twice a day   Start taking CoQ10 300 mg  once a daily   Increase Metoprolol succinate ( Toprol XL )  50 mg  one week after starting this medication  Start taking Zetia 10 mg one tablet daily\ *If you need a refill on your cardiac medications before your next appointment, please call your pharmacy*   Lab Work: In 2 months ( April 2023)  CMP LIPID If you have labs (blood work) drawn today and your tests are completely  normal, you will receive your results only by: MyChart Message (if you have MyChart) OR A paper copy in the mail If you have any lab test that is abnormal or we need to change your treatment, we will call you to review the results.   Testing/Procedures:  Will be mailed to your home  3 to 5 days Your physician has recommended that you wear a holter monitor 7 days ZIO . Holter monitors are medical devices that record the hearts electrical activity. Doctors most often use these monitors to diagnose arrhythmias. Arrhythmias are problems with the speed or rhythm of the heartbeat. The monitor is a small, portable device. You can wear one while you do your normal daily activities. This is usually used to diagnose what is causing palpitations/syncope (passing out).    Follow-Up: At [facility], you and your health needs are our priority.  As part of our continuing mission to provide you with exceptional heart care, we have created designated Provider Care Teams.  These Care Teams include your primary Cardiologist (physician) and Advanced Practice Providers (APPs -  Physician Assistants and Nurse Practitioners) who all work together to provide you with the care you need, when you need it.  We recommend signing up for the patient portal called "MyChart".  Sign up information is provided on this After Visit Summary.  MyChart is used to connect with patients for Virtual Visits (Telemedicine).  Patients are able to view lab/test results, encounter notes, upcoming appointments, etc.  Non-urgent messages can be sent to your provider as well.   To learn more about what you can do with MyChart, go to [email or web address].    Your next appointment:   4 month(s)  The format for your next appointment:   In Person  Provider:   Glenetta Hew, MD     Other Instructions ZIO XT- Long Term Monitor Instructions  Your physician has requested you wear a ZIO patch monitor for 7 days.  This is a single patch  monitor. [employer] supplies one patch monitor per enrollment. Additional stickers are not available. Please  do not apply patch if you will be having a Nuclear Stress Test,  Echocardiogram, Cardiac CT, MRI, or Chest Xray during the period you would be wearing the  monitor. The patch cannot be worn during these tests. You cannot remove and re-apply the  ZIO XT patch monitor.  Your ZIO patch monitor will be mailed 3 day USPS to your address on file. It may take 3-5 days  to receive your monitor after you have been enrolled.  Once you have received your monitor, please review the enclosed instructions. Your monitor  has already been registered assigning a specific monitor serial # to you.  Billing and Patient Assistance Program Information  We have supplied [employer] with any of your insurance information on file for billing purposes. [employer] offers a sliding scale Patient Assistance Program for patients that do not have  insurance, or whose insurance does not completely cover the cost of the ZIO monitor.  You must apply for the Patient Assistance Program to qualify for this discounted rate.  To apply, please call [employer] at [phone], select option 4, select option 2, ask to apply for  Patient Assistance Program. Theodore Demark will ask your household income, and how many people  are in your household. They will quote your out-of-pocket cost based on that information.  [employer] will also be able to set up a 47-month interest-free payment plan if needed.  Applying the monitor   Shave hair from upper left chest.  Hold abrader disc by orange tab. Rub abrader in 40 strokes over the upper left chest as  indicated in your monitor instructions.  Clean area with 4 enclosed alcohol pads. Let dry.  Apply patch as indicated in monitor instructions. Patch will be placed under collarbone on left  side of chest with arrow pointing upward.  Rub patch adhesive wings for 2 minutes. Remove white label marked "1".  Remove the white  label marked "2". Rub patch adhesive wings for 2 additional minutes.  While looking in a mirror, press and release button in center of patch. A small green light will  flash 3-4 times. This will be your only indicator that the monitor has been turned on.  Do not shower for the first 24 hours. You may shower after the first 24 hours.  Press the button if you feel a symptom. You will hear a small click. Record Date, Time and  Symptom in the Patient Logbook.  When you are ready to remove the patch, follow instructions on the last 2 pages of Patient  Logbook. Stick patch monitor onto the last page of Patient Logbook.  Place Patient Logbook in the blue and white box. Use locking tab on box and tape box closed  securely. The blue and white box has prepaid postage on it. Please place it in the mailbox as  soon as possible. Your physician should have your test results approximately 7 days after the  monitor [facility]  Call [place]at [phone] you have questions regarding  your ZIO XT patch monitor. Call them immediately if you see an orange light blinking on your  monitor.  If your monitor falls off in less than 4 days, contact our Monitor department at [phone]  If your monitor becomes loose or falls off after 4 days call [employer] at [phone]  suggestions on securing your monitor      DGlenetta Hew M.D., M.S. Interventional Cardiologist   Pager # [phone] # [phone]  [address] [place], [place] [address]   Thank you for choosing Heartcare at [facility]!!

## 2021-05-05 ENCOUNTER — Encounter: Payer: Self-pay | Admitting: Cardiology

## 2021-05-05 NOTE — Assessment & Plan Note (Signed)
Period of heart fluttering sensations: Check Zio patch monitor.  Need to exclude significant arrhythmia based on her having history of polymorphic VT. (PMVT)  Check Zio patch monitor, increase Toprol to 50 mg daily.

## 2021-05-05 NOTE — Assessment & Plan Note (Signed)
No further episodes that I can tell, but I wonder if these short-lived spells that she is having where she describes feeling her chest "shutting down" could be brief burst of arrhythmia.  Plan: Increase Toprol to 50 mg daily and check 7-day Zio patch monitor

## 2021-05-05 NOTE — Telephone Encounter (Signed)
The plan was for [place] to write a prescription for to have her Plavix filled the same way his Plavix is filled.  The current prescription she has is very large tablets that are difficult to swallow.  Her husband tablets are much easier to swallow.  We just need to make sure that she gets the same pills that he gets.  Glenetta Hew, MD

## 2021-05-05 NOTE — Assessment & Plan Note (Signed)
DES PCI to the OM1 with a 2.5 mm x 60 mm Synergy stent.  On aspirin and Plavix with plan DAPT x1 year.  No bleeding issues at this point.  On max tolerable dose of statin along with beta-blocker which I am increasing to 50 mg daily.  Not currently on ACE inhibitor or ARB, with preserved EF, will hold off and see what blood pressure looks like in follow-up.

## 2021-05-05 NOTE — Assessment & Plan Note (Signed)
Lipids reviewed.  Not quite at goal.  We will add Zetia 10 mg daily to her current dose of Crestor.  She would not able to tolerate higher doses, and is having a hard time with the 10 mg Crestor right now.  I told her to take 5 mg twice daily to see if she can tolerate.  May need to refer to CVRR for PCSK9 inhibitor.

## 2021-05-05 NOTE — Assessment & Plan Note (Signed)
Blood pressure stable today on current dose of Toprol, but with concern for possible palpitations and PMVT, will increase Toprol to 50 mg daily.

## 2021-05-07 MED ORDER — NITROGLYCERIN 0.4 MG SL SUBL: 0.4000 mg | SUBLINGUAL_TABLET | SUBLINGUAL | 6 refills | Status: AC | PRN

## 2021-05-07 MED ORDER — ROSUVASTATIN CALCIUM 10 MG PO TABS: 5.0000 mg | ORAL_TABLET | Freq: Two times a day (BID) | ORAL | 3 refills | Status: DC

## 2021-05-07 MED ORDER — CLOPIDOGREL BISULFATE 75 MG PO TABS: 75.0000 mg | ORAL_TABLET | Freq: Every day | ORAL | 3 refills | Status: DC

## 2021-05-14 ENCOUNTER — Telehealth: Payer: Self-pay | Admitting: *Deleted

## 2021-05-14 NOTE — Telephone Encounter (Signed)
Hello,   We have found that a Zio XT device has been delayed in transit to our mutual patient. We would like to inform you that we have placed an order for a replacement device to be overnighted to the patient, to avoid any further delay. I have included the tracking number below for the patient's delayed shipment.    We will also place a hold on the billing in the event the original monitor does arrive.  Patient InitialsLillia Perez MRN: [id number] SN: [id number] [place] Tracking Number: [id number]   If you have any questions, please call us at [phone].   Thank you,   Chanel  [employer] Customer Care

## 2021-05-15 NOTE — Telephone Encounter (Signed)
According to [facility] website ZIO XT serial # [id number] shipped to patient 05/15/21.

## 2021-05-15 NOTE — Telephone Encounter (Signed)
Patient called stating [employer] called her to send back the monitor, she told them she never received the first one.  She states she still hasn't received the second one that was supposed to be sent overnight to her.

## 2021-05-16 DIAGNOSIS — R002 Palpitations: Secondary | ICD-10-CM

## 2021-05-16 DIAGNOSIS — I4729 Other ventricular tachycardia: Secondary | ICD-10-CM | POA: Diagnosis not present

## 2021-05-22 ENCOUNTER — Encounter: Payer: Self-pay | Admitting: Cardiology

## 2021-05-23 HISTORY — PX: OTHER SURGICAL HISTORY: SHX169

## 2021-05-24 NOTE — Telephone Encounter (Signed)
Unfortunately, Plavix is probably the best option for her to be on.  She can hold the aspirin for a week and then start taking it every other day but stay on the Plavix every day.  This is to keep the stent open.  After 6 months of being on aspirin and Plavix, we can stop the aspirin. ? ?If the ezetimibe is hurting the stomach, we will get a probably to figure out something else to use for cholesterol.  Lets try Nexletol.   ? ?Rx: Nexletol (bempedoic acid) 180 mg p.o. daily; dispense 30 tabs, 3 refills ? ? ?Glenetta Hew, MD ? ?

## 2021-05-25 MED ORDER — NEXLETOL 180 MG PO TABS: 1.0000 | ORAL_TABLET | Freq: Every morning | ORAL | 3 refills | Status: AC

## 2021-05-25 MED ORDER — NEXLETOL 180 MG PO TABS: 1.0000 | ORAL_TABLET | Freq: Every morning | ORAL | 3 refills | Status: DC

## 2021-05-25 NOTE — Addendum Note (Signed)
Addended by: Waylan Rocher on: 05/25/2021 09:50 AM ? ? Modules accepted: Orders ? ?

## 2021-05-28 ENCOUNTER — Telehealth: Payer: Self-pay | Admitting: Cardiology

## 2021-05-28 NOTE — Telephone Encounter (Signed)
Pt c/o medication issue: ? ?1. Name of Medication: Bempedoic Acid (NEXLETOL) 180 MG TABS ? ?2. How are you currently taking this medication (dosage and times per day)? Has not started ? ?3. Are you having a reaction (difficulty breathing--STAT)? no ? ?4. What is your medication issue? Patient states the medication was denied from her insurance and they are requiring something be sent to them stating she needs it.  ?

## 2021-05-28 NOTE — Telephone Encounter (Signed)
Returned call to patient, advised PA may be needed.  Aware nurse is out of office today and will contact her once completed.   ? ? ?

## 2021-05-30 NOTE — Telephone Encounter (Signed)
Returned call to patient, advised patient that I would forward message to Ivin Booty and Prior Auth nurse to check on status of this. Patient verbalized understanding.  ?

## 2021-05-30 NOTE — Telephone Encounter (Signed)
As per my note, the patient is on max tolerated dose of statin, did not tolerate ezetimibe.  Therefore Nexletol would be the next option. ? ?She is on a statin at max tolerated dose and not at goal.  Hence the need for an additional medication. ? ?Will forward to CVRR team to assist. ?Glenetta Hew, MD ? ?

## 2021-05-30 NOTE — Telephone Encounter (Signed)
Patient is calling to check on status of prior-auth. ?

## 2021-05-30 NOTE — Telephone Encounter (Signed)
**Note De-Identified Archibald Marchetta Obfuscation** I called [place], [place] - [address] (Ph: [phone]) and s/w the pharmacist who states that she can see that a PA was started on the pts Nexletol on 3/3 and that it was denied because the pt's plan requires that the pt first try and fail generic statins such as: ?Atorvastatin ?Pravastatin ?Rosuvastatin (currently taking) ?Simvastatin ?Lovastatin ?Ezeibe (pt has taken but could not tolerate due to GI issues) ? ?Will forward to Dr Ellyn Hack and his nurse for advisement to the pt. ? ? ? ?

## 2021-05-31 NOTE — Telephone Encounter (Signed)
Spoke to patient and informed medication was approved . She  should be able to pick medication  ? ? ?Michelle Perez ([facility]) - [id number] ?Nexletol '180MG'$  tablets ?Status: PA Response - Approved ?Created: March 6th, 2023 [phone] ?Sent: March 8th, 2023 ? ?

## 2021-05-31 NOTE — Telephone Encounter (Signed)
Called pharmacy - [facility] -  medication went through - cost $10 and some change ?

## 2021-06-07 ENCOUNTER — Encounter: Payer: Self-pay | Admitting: Cardiology

## 2021-06-26 ENCOUNTER — Emergency Department (HOSPITAL_COMMUNITY)
Admission: EM | Admit: 2021-06-26 | Discharge: 2021-06-26 | Disposition: A | Discharge status: Home / Self Care | Payer: Medicare Other | Attending: Student | Admitting: Student

## 2021-06-26 ENCOUNTER — Encounter: Payer: Self-pay | Admitting: Cardiology

## 2021-06-26 ENCOUNTER — Other Ambulatory Visit: Payer: Self-pay

## 2021-06-26 ENCOUNTER — Encounter (HOSPITAL_COMMUNITY): Payer: Self-pay

## 2021-06-26 ENCOUNTER — Emergency Department (HOSPITAL_COMMUNITY): Payer: Medicare Other

## 2021-06-26 DIAGNOSIS — R079 Chest pain, unspecified: Secondary | ICD-10-CM | POA: Diagnosis present

## 2021-06-26 DIAGNOSIS — Z5321 Procedure and treatment not carried out due to patient leaving prior to being seen by health care provider: Secondary | ICD-10-CM | POA: Insufficient documentation

## 2021-06-26 LAB — CBC
HCT: 41.3 % (ref 36.0–46.0)
Hemoglobin: 14.2 g/dL (ref 12.0–15.0)
MCH: 32.6 pg (ref 26.0–34.0)
MCHC: 34.4 g/dL (ref 30.0–36.0)
MCV: 94.7 fL (ref 80.0–100.0)
Platelets: 264 10*3/uL (ref 150–400)
RBC: 4.36 MIL/uL (ref 3.87–5.11)
RDW: 12.2 % (ref 11.5–15.5)
WBC: 8.9 10*3/uL (ref 4.0–10.5)
nRBC: 0 % (ref 0.0–0.2)

## 2021-06-26 LAB — BASIC METABOLIC PANEL
Anion gap: 10 (ref 5–15)
BUN: 10 mg/dL (ref 8–23)
CO2: 28 mmol/L (ref 22–32)
Calcium: 9.4 mg/dL (ref 8.9–10.3)
Chloride: 99 mmol/L (ref 98–111)
Creatinine, Ser: 0.85 mg/dL (ref 0.44–1.00)
GFR, Estimated: >60 mL/min (ref >60)
Glucose, Bld: 131 mg/dL — ABNORMAL HIGH (ref 70–99)
Potassium: 3.7 mmol/L (ref 3.5–5.1)
Sodium: 137 mmol/L (ref 135–145)

## 2021-06-26 LAB — TROPONIN I (HIGH SENSITIVITY): Troponin I (High Sensitivity): 4 ng/L (ref <18)

## 2021-06-26 NOTE — Telephone Encounter (Signed)
Spoke to patient she stated she has been having chest pain off and on all day.Stated she had a strange feeling in chest last night.She felt like a bubble in chest,chest tightness.Rates chest pain at present # 3.She is also sob.Advised she needs to go to ED to be evaluated.I will make Dr.Harding aware. ?

## 2021-06-26 NOTE — ED Triage Notes (Addendum)
Pt reports left sided sharp chest pain, SOB and nausea, onset today around 1600 that radiates to her neck. Hx of NSTEMI ?

## 2021-06-26 NOTE — ED Provider Triage Note (Signed)
Emergency Medicine Provider Triage Evaluation Note ? ?Michelle Perez , a 66 y.o. female with history of NSTEMI in January 2023 status post stent was evaluated in triage.  Pt complains of chest pain that started this morning that has been intermittent throughout the day, lasting approximately 5 minutes each time.  Patient follows with Dr. Selena Batten, her cardiologist, and she called him earlier today.  She was referred to the ED here for additional evaluation.  She denies shortness of breath, diaphoresis, abdominal pain, nausea, vomiting, diarrhea. ? ?Review of Systems  ?Positive:  ?Negative:  ? ?Physical Exam  ?BP (!) 160/58 (BP Location: Left Arm)   Pulse 88   Temp 98.4 ?F (36.9 ?C) (Oral)   Resp 16   Ht '5\' 2"'$  (1.575 m)   Wt 56.2 kg   SpO2 99%   BMI 22.68 kg/m?  ?Gen:   Awake, no distress   ?Resp:  Normal effort  ?MSK:   Moves extremities without difficulty  ?Other:   ? ?Medical Decision Making  ?Medically screening exam initiated at 7:01 PM.  Appropriate orders placed.  Michelle Perez was informed that the remainder of the evaluation will be completed by another provider, this initial triage assessment does not replace that evaluation, and the importance of remaining in the ED until their evaluation is complete. ? ? ?  ?Tonye Pearson, [place] ?06/26/21 1902 ? ?

## 2021-06-26 NOTE — ED Notes (Signed)
Pt gave registration labels and states she is leaving ?

## 2021-07-07 LAB — COMPREHENSIVE METABOLIC PANEL
ALT: 15 IU/L (ref 0–32)
AST: 26 IU/L (ref 0–40)
Albumin/Globulin Ratio: 2.4 — ABNORMAL HIGH (ref 1.2–2.2)
Albumin: 4.6 g/dL (ref 3.8–4.8)
Alkaline Phosphatase: 58 IU/L (ref 44–121)
BUN/Creatinine Ratio: 13 (ref 12–28)
BUN: 11 mg/dL (ref 8–27)
Bilirubin Total: 0.5 mg/dL (ref 0.0–1.2)
CO2: 26 mmol/L (ref 20–29)
Calcium: 9.4 mg/dL (ref 8.7–10.3)
Chloride: 99 mmol/L (ref 96–106)
Creatinine, Ser: 0.83 mg/dL (ref 0.57–1.00)
Globulin, Total: 1.9 g/dL (ref 1.5–4.5)
Glucose: 88 mg/dL (ref 70–99)
Potassium: 4.8 mmol/L (ref 3.5–5.2)
Sodium: 139 mmol/L (ref 134–144)
Total Protein: 6.5 g/dL (ref 6.0–8.5)
eGFR: 77 mL/min/{1.73_m2} (ref >59)

## 2021-07-07 LAB — LIPID PANEL
Chol/HDL Ratio: 2.2 ratio (ref 0.0–4.4)
Cholesterol, Total: 154 mg/dL (ref 100–199)
HDL: 69 mg/dL (ref >39)
LDL Chol Calc (NIH): 69 mg/dL (ref 0–99)
Triglycerides: 87 mg/dL (ref 0–149)
VLDL Cholesterol Cal: 16 mg/dL (ref 5–40)

## 2021-08-28 ENCOUNTER — Encounter: Payer: Self-pay | Admitting: Cardiology

## 2021-08-28 ENCOUNTER — Ambulatory Visit: Payer: Medicare Other | Admitting: Cardiology

## 2021-08-28 VITALS — BP 130/70 | HR 68 | Ht 62.0 in | Wt 127.0 lb

## 2021-08-28 DIAGNOSIS — E785 Hyperlipidemia, unspecified: Secondary | ICD-10-CM

## 2021-08-28 DIAGNOSIS — Z9861 Coronary angioplasty status: Secondary | ICD-10-CM

## 2021-08-28 DIAGNOSIS — I4729 Other ventricular tachycardia: Secondary | ICD-10-CM

## 2021-08-28 DIAGNOSIS — I10 Essential (primary) hypertension: Secondary | ICD-10-CM

## 2021-08-28 DIAGNOSIS — I2510 Atherosclerotic heart disease of native coronary artery without angina pectoris: Secondary | ICD-10-CM

## 2021-08-28 DIAGNOSIS — I208 Other forms of angina pectoris: Secondary | ICD-10-CM | POA: Insufficient documentation

## 2021-08-28 DIAGNOSIS — I498 Other specified cardiac arrhythmias: Secondary | ICD-10-CM

## 2021-08-28 MED ORDER — METOPROLOL SUCCINATE ER 25 MG PO TB24: 25.0000 mg | ORAL_TABLET | Freq: Every evening | ORAL | 2 refills | Status: AC

## 2021-08-28 NOTE — Progress Notes (Incomplete)
Primary Care Provider: Jola Baptist, PA-C [facility] Cardiologist: Glenetta Hew, MD Electrophysiologist: None  Clinic Note: No chief complaint on file.  ===================================  ASSESSMENT/PLAN   Problem List Items Addressed This Visit       Cardiology Problems   Atypical angina [facility])   Relevant Medications   metoprolol succinate (TOPROL XL) 25 MG 24 hr tablet   Other Relevant Orders   Cardiac Stress Test: Informed Consent Details: Physician/Practitioner Attestation; Transcribe to consent form and obtain patient signature   MYOCARDIAL PERFUSION IMAGING   CAD S/P percutaneous coronary angioplasty - Primary (Chronic)   Relevant Medications   metoprolol succinate (TOPROL XL) 25 MG 24 hr tablet   Other Relevant Orders   Cardiac Stress Test: Informed Consent Details: Physician/Practitioner Attestation; Transcribe to consent form and obtain patient signature   MYOCARDIAL PERFUSION IMAGING   Essential hypertension (Chronic)   Relevant Medications   metoprolol succinate (TOPROL XL) 25 MG 24 hr tablet   Hyperlipidemia with target LDL less than 70 (Chronic)   Relevant Medications   metoprolol succinate (TOPROL XL) 25 MG 24 hr tablet     Other   Periodic heart flutter (Chronic)   Relevant Orders   MYOCARDIAL PERFUSION IMAGING   ===================================  HPI:    Michelle Perez is a 66 y.o. female with a recent MI-PCI along with HTN, HLD is being seen today *** post MI at the request of Maryella Shivers, MD.  NSTEMI (03/29/2021):  Initial presentation was to stand-alone ER in [place], [place] -> shortly after initiation of evaluation, she suffered cardiac arrest (primary VT-defib x1, IV amiodarone).  Transferred to [facility] -> EMERGENT CATH: 99% OM1 lesion => PCI  IVUS confirmed Moderate LM-Ost LCx Preserved EF on echo:  Discharged on Aspirin & Plavix, Toprol 25 mg daily, rosuvastatin 10 mg daily.  Lasix 20 mg  daily, Synthroid 25 mcg. Michelle Perez was just seen on 05/04/21  several weeks after being seen by Dr. Gerarda Gunther for initial post hospital follow-up visit after MI-PCI while Her-camping at [facility], [place].  Unhappy with initial appointment, she chose to transfer cardiology care.  She noted having quite a bit of Anxiety about her heart attack and had multiple questions about existing disease etc. she had multiple questions that were asked and answered.  She was having lots of "niggling discomfort/pain in her chest following her MI and lots of fluttering sensations where she would feel as though her heart would "shut down.  " She was quite active with exercise = walking back and forth to the mailbox (about 300 feet from the house).  No Angina or Dyspnea with rest or exertion.  No PND, orthopnea or edema.  No syncope or near syncope, TIA or BX.  Recent Hospitalizations:  ER 06/26/21: ER presentation with chest pain began in the morning.  Intermittent throughout the day.  5 minutes at a time.  No dyspnea diaphoresis abdominal pain etc => initial troponin was negative.  She left before complete evaluation.  Reviewed  CV studies:    The following studies were reviewed today: (if available, images/films reviewed: From Epic Chart or Care Everywhere) Zio Monitor results March 2023:  Overall pretty normal study.  No grossly abnormal findings.  Predominant rhythm Is Sinus Rhythm: Heart rate range 53 to 125 bpm with an average of 69 bpm.  Very rare PVCs, and rare PACs with some couplets and triplets.  1 atrial run 4 beats at a rate of 118 bpm. =>  This s episode was noted  Other than the quadruplet atrial run, symptoms noted with sinus rhythm as well as sinus rhythm with PACs or PVCs.  No sustained tachycardic arrhythmias: No atrial fibrillation, atrial flutter, supraventricular tachycardia, atrial tachycardia, ventricular tachycardia  No pauses or notable bradycardia  Interval History:   Michelle Perez presents  here today for follow-up after her monitor but also ER visit that she never really completed.  She says that the palpitation symptoms are still there lasting anywhere from 5 to 10 seconds but may last less than 1 minute.  They are not as prominent or is concerning to her.  She says that her PCP may have heard something and was ordered carotid Dopplers.  More concerning is that she is having more episodes of off-and-on chest pain that may or may not occur when she is vacuuming or doing house chores.  Symptoms with gardening.  She says this little different more related to her upper chest and upper left upper shoulder as opposed to the pain that she had with her heart attack, but it does happen with exertion and is relieved with rest.  She has not taken nitroglycerin.  The symptoms are not at all associated with shortness of breath or irregular heartbeats palpitations.  Cardiovascular ROS: positive for - chest pain, irregular heartbeat, palpitations, and rapid heart rate negative for - dyspnea on exertion, edema, orthopnea, paroxysmal nocturnal dyspnea, shortness of breath, or lightheadedness, dizziness or wooziness, syncope/near syncope or TIA/amaurosis fugax, claudication   REVIEWED OF SYSTEMS   Review of Systems  Constitutional:  Positive for malaise/fatigue (Little more exercise intolerance). Negative for weight loss.  HENT:  Negative for nosebleeds.   Respiratory:  Negative for cough and shortness of breath.   Cardiovascular:        Per HPI  Gastrointestinal:  Negative for blood in stool and melena.  Genitourinary:  Negative for hematuria.  Musculoskeletal:  Negative for joint pain and myalgias.  Neurological:  Positive for dizziness (Sometimes if she stands up quickly). Negative for focal weakness and weakness.  Psychiatric/Behavioral:  Negative for depression and memory loss. The patient is nervous/anxious. The patient does not have insomnia.    I have reviewed and (if needed) personally  updated the patient's problem list, medications, allergies, past medical and surgical history, social and family history.   PAST MEDICAL HISTORY   Past Medical History:  Diagnosis Date  . Arthritis   . Cancer ([place])    skin cancer - nose  . COPD (chronic obstructive pulmonary disease) ([place])   . Coronary artery disease involving native coronary artery of native heart 03/29/2021   Non-STEMI with VT arrest: 99% OM1-DES PCI, ostial LCx 30 to 40%.  Left main 40 to 50% nonocclusive by IVUS (MLA 6.4 mm) nonobstructive LAD.  . Diabetes mellitus without complication (HCC)    type 2 , diet and exercise controlled  . GERD (gastroesophageal reflux disease)   . H/O hiatal hernia   . Hyperlipemia   . Non-STEMI (non-ST elevated myocardial infarction) ([place]) 27/74/1287   Complicated by polymorphic V. tach -> occurred in the ER, defibrillated x1.:  99% OM1-PCI   NSTEMI- She & her husband describe the incident that occurred leading to her heart attack.  They were down in [place], [place] camping in a camper.  They were loading up to drive home when she started having chest discomfort that would not go away.  They were able to find a local stand-alone ER.  Upon initial evaluation there,  she actually went to VT arrest requiring defibrillation.  She was then transferred to [facility] for PCI and did well after that.  PAST SURGICAL HISTORY   Past Surgical History:  Procedure Laterality Date  . ABDOMINAL HYSTERECTOMY  03/25/1982  . APPENDECTOMY    . CARDIOVASCULAR STRESS TEST  11/29/2011   poor image quality, no evidence of ischemia in well visualized segments, inferior lateral wall impossible to evaluate secondary to significant GI artifiact, preserved EF 56% [facility])  . CORONARY STENT INTERVENTION  03/29/2021   [facility]: DES PCI-OM1 (full report not available)  . LEFT HEART CATH AND CORONARY ANGIOGRAPHY  03/29/2021   [facility]: Normal aortic pressure.  40 to 50%  LEFT MAIN (IVUS MLA 6.4 mm).  30 to 40% ostial LCx and 30% ostial LAD.  99% OM1-DES PCI.  30 to 40% ostial LCx also evaluated by IVUS.  Marland Kitchen REVERSE SHOULDER ARTHROPLASTY Right 04/01/2013   Procedure: REVERSE SHOULDER ARTHROPLASTY;  Surgeon: Nita Sells, MD;  Location: [place];  Service: Orthopedics;  Laterality: Right;  . ROTATOR CUFF REPAIR Right    x3  . SKIN CANCER EXCISION     nose and back  . TONSILLECTOMY     and adenoids age 33  . TRANSTHORACIC ECHOCARDIOGRAM     Normal EF 60 to 65%.  Hypokinesis of the entire inferolateral wall..  Diastolic function noted.  Mild MR with mild MAC.  AOV sclerosis but no stenosis.   LHC / PCI 03/29/21: CULPRIT-99% OM1 (DES PCI); LM- Ostial LCx (IVUS-40 and 50% LM MLA 6.4 mm, 30-40% ostLCx), nonobstructive LAD.  Normal MA  Echo 03/30/2021: Normal LV size and function.  EF 60 to be 5%.  Hypokinesis of the entire inferolateral wall otherwise normal wall motion.  Normal diastolic function.  Normal RV function.  Normal left atrial size.  Mild MAC.  Mild aortic valve sclerosis.  Normal PAP.  There is no immunization history on file for this patient.  MEDICATIONS/ALLERGIES   Current Meds  Medication Sig  . aspirin 81 MG EC tablet Take by mouth.  . Bempedoic Acid (NEXLETOL) 180 MG TABS Take 1 tablet by mouth in the morning.  . clopidogrel (PLAVIX) 75 MG tablet Take 1 tablet (75 mg total) by mouth daily.  . Coenzyme Q10 (COQ-10) 100 MG CAPS Take 300 mg by mouth daily.  Marland Kitchen dexlansoprazole (DEXILANT) 60 MG capsule Take 1 capsule by mouth daily.  Marland Kitchen estrogens, conjugated, (PREMARIN) 0.3 MG tablet Take 0.3 mg by mouth daily. Take daily for 21 days then do not take for 7 days.  Marland Kitchen ezetimibe (ZETIA) 10 MG tablet Take 1 tablet (10 mg total) by mouth daily.  . furosemide (LASIX) 20 MG tablet Take 20 mg by mouth daily.  Marland Kitchen levothyroxine (SYNTHROID) 25 MCG tablet Take 25 mcg by mouth every morning.  . metoprolol succinate (TOPROL XL) 25 MG 24 hr tablet Take 1  tablet (25 mg total) by mouth every evening. Take in addition to the 50 mg in the morning  . Multiple Vitamins-Minerals (CENTRUM SILVER 50+WOMEN) TABS Take by mouth.  . nitroGLYCERIN (NITROSTAT) 0.4 MG SL tablet Place 1 tablet (0.4 mg total) under the tongue every 5 (five) minutes as needed for chest pain. Up to 3 times with each episode  . potassium chloride (MICRO-K) 10 MEQ CR capsule Take 10 mEq by mouth daily.  . rizatriptan (MAXALT-MLT) 10 MG disintegrating tablet Take by mouth.  . rosuvastatin (CRESTOR) 10 MG tablet Take 0.5 tablets (5  mg total) by mouth 2 (two) times daily.  . tapentadol (NUCYNTA) 100 MG 12 hr tablet Take 100 mg by mouth every 12 (twelve) hours. Take 1 tablet by mouth twice a day.  . tapentadol (NUCYNTA) 50 MG tablet Take 50 mg by mouth. Take 1 tablet by mouth daily as needed .    Allergies  Allergen Reactions  . Codeine Hives    vicodin is okay  . Tape Rash    SOCIAL HISTORY/FAMILY HISTORY   Reviewed in Epic:   Social History   Tobacco Use  . Smoking status: Every Day    Packs/day: 0.60    Years: 43.00    Pack years: 25.80    Types: Cigarettes  Substance Use Topics  . Alcohol use: No  . Drug use: No   Social History   Social History Narrative  . Not on file   Family History  Problem Relation Age of Onset  . Coronary artery disease Mother 34  . Heart attack Mother 39  . Heart attack Brother 58       Several heart attacks  . Coronary artery disease Brother   . Congestive Heart Failure Brother     OBJCTIVE -PE, EKG, labs   Wt Readings from Last 3 Encounters:  08/28/21 127 lb (57.6 kg)  06/26/21 124 lb (56.2 kg)  05/04/21 127 lb 3.2 oz (57.7 kg)    Physical Exam: BP 130/70   Pulse 68   Ht _0  (1.575 m)   Wt 127 lb (57.6 kg)   SpO2 97%   BMI 23.23 kg/m  Physical Exam Vitals reviewed.  Constitutional:      General: She is not in acute distress.    Appearance: Normal appearance. She is not ill-appearing or toxic-appearing.   HENT:     Head: Normocephalic and atraumatic.  Neck:     Vascular: No carotid bruit (Given exclude soft radiated AOV murmur) or JVD.  Cardiovascular:     Rate and Rhythm: Normal rate and regular rhythm. Occasional Extrasystoles are present.    Chest Wall: PMI is not displaced.     Pulses: Normal pulses.     Heart sounds: S1 normal and S2 normal. Murmur (Soft 1/6 SEM at RUSB-neck) heard.    No friction rub. No gallop.  Pulmonary:     Effort: Pulmonary effort is normal. No respiratory distress.     Breath sounds: Normal breath sounds. No wheezing, rhonchi or rales.  Chest:     Chest wall: No tenderness.  Abdominal:     General: Abdomen is flat. Bowel sounds are normal. There is no distension.     Palpations: Abdomen is soft. There is no mass (No HSM or bruit).     Tenderness: There is no abdominal tenderness.  Musculoskeletal:        General: No swelling. Normal range of motion.     Cervical back: Normal range of motion.  Skin:    General: Skin is warm and dry.  Neurological:     General: No focal deficit present.     Mental Status: She is alert and oriented to person, place, and time.     Cranial Nerves: No cranial nerve deficit.     Gait: Gait normal.  Psychiatric:        Mood and Affect: Mood normal.        Behavior: Behavior normal.        Thought Content: Thought content normal.        Judgment: Judgment normal.  Adult ECG Report  Rate: 91;  Rhythm: normal sinus rhythm and normal axis, intervals and durations.  Left atrial enlargement  ;   Narrative Interpretation: Relatively normal EKG.  Recent Labs: Reviewed in [facility] 03/30/2021: TC 159, TG 119, HDL 50.6, LDL 84.6; AST 55 ALT 21, alk phos 71.  Cr 0.73, K+ 3.9     Latest Ref Rng & Units 06/26/2021    6:24 PM 04/02/2013    5:50 AM 03/26/2013    9:00 AM  CBC  WBC 4.0 - 10.5 K/uL 8.9   8.8   9.7    Hemoglobin 12.0 - 15.0 g/dL 14.2   10.7   14.3    Hematocrit 36.0 - 46.0 % 41.3   31.2   42.5    Platelets 150 -  400 K/uL 264   186   264      No results found for: HGBA1C No results found for: TSH  ==================================================  COVID-19 Education: The signs and symptoms of COVID-19 were discussed with the patient and how to seek care for testing (follow up with PCP or arrange E-visit).    I spent a total of 29 minutes with the patient spent in direct patient consultation.  -> Questions asked and answered.  Reviewed report and echo with the patient. Additional time spent with chart review  / charting (studies, outside notes, etc): 27 min  Total Time: 56 min  Current medicines are reviewed at length with the patient today.  (+/- concerns) n/a  This visit occurred during the SARS-CoV-2 public health emergency.  Safety protocols were in place, including screening questions prior to the visit, additional usage of staff PPE, and extensive cleaning of exam room while observing appropriate contact time as indicated for disinfecting solutions.  Notice: This dictation was prepared with Dragon dictation along with smart phrase technology. Any transcriptional errors that result from this process are unintentional and may not be corrected upon review.   Studies Ordered:  Orders Placed This Encounter  Procedures  . Cardiac Stress Test: Informed Consent Details: Physician/Practitioner Attestation; Transcribe to consent form and obtain patient signature  . MYOCARDIAL PERFUSION IMAGING    Patient Instructions / Medication Changes & Studies & Tests Ordered   Patient Instructions  Medication Instructions:   Do not take the Nextletol for one month   after 2 weeks of  the medication  being hold   reduce  Metoprolol succinate 50  mg  in the morning and  metoprolol succinate 25 mg in the evening.   *If you need a refill on your cardiac medications before your next appointment, please call your pharmacy*   Lab Work: Not needed   Testing/Procedures:  No schedule at [address] Your physician has requested that you have en exercise stress myoview.  Please follow instruction sheet, as given.   Follow-Up: At [facility], you and your health needs are our priority.  As part of our continuing mission to provide you with exceptional heart care, we have created designated Provider Care Teams.  These Care Teams include your primary Cardiologist (physician) and Advanced Practice Providers (APPs -  Physician Assistants and Nurse Practitioners) who all work together to provide you with the care you need, when you need it.     Your next appointment:   6 week(s)  The format for your next appointment:   In Person  Provider:   Glenetta Hew, MD        Glenetta Hew, M.D., M.S.  Interventional Cardiologist   Pager # [phone] Phone # [phone] [address] [place], [place] [address]   Thank you for choosing Heartcare at [facility]!!

## 2021-08-28 NOTE — Patient Instructions (Addendum)
Medication Instructions:   Do not take the Nextletol for one month   after 2 weeks of  the medication  being hold   reduce  Metoprolol succinate 50  mg  in the morning and  metoprolol succinate 25 mg in the evening.   *If you need a refill on your cardiac medications before your next appointment, please call your pharmacy*   Lab Work: Not needed   Testing/Procedures:  No schedule at [address] Your physician has requested that you have en exercise stress myoview.  Please follow instruction sheet, as given.   Follow-Up: At [facility], you and your health needs are our priority.  As part of our continuing mission to provide you with exceptional heart care, we have created designated Provider Care Teams.  These Care Teams include your primary Cardiologist (physician) and Advanced Practice Providers (APPs -  Physician Assistants and Nurse Practitioners) who all work together to provide you with the care you need, when you need it.     Your next appointment:   6 week(s)  The format for your next appointment:   In Person  Provider:   Glenetta Hew, MD

## 2021-08-28 NOTE — Progress Notes (Unsigned)
Primary Care Provider: [facility] Cardiologist: Glenetta Hew, MD Electrophysiologist: None  Clinic Note: No chief complaint on file.  ===================================  ASSESSMENT/PLAN   Problem List Items Addressed This Visit       Cardiology Problems   Atypical angina [facility])   Relevant Orders   Cardiac Stress Test: Informed Consent Details: Physician/Practitioner Attestation; Transcribe to consent form and obtain patient signature   CAD S/P percutaneous coronary angioplasty - Primary (Chronic)   Relevant Orders   Cardiac Stress Test: Informed Consent Details: Physician/Practitioner Attestation; Transcribe to consent form and obtain patient signature   Essential hypertension (Chronic)   Hyperlipidemia with target LDL less than 70 (Chronic)     Other   Periodic heart flutter (Chronic)   ===================================  HPI:    Michelle Perez is a 66 y.o. female with a recent MI-PCI along with HTN, HLD is being seen today T*** post MI at the request of Maryella Shivers, MD.    January 19 for hospital follow-up by Dr. Gerarda Gunther from [facility] in [place].  She had done well since discharge with exception of 1 episode of short-lived chest discomfort.  BP controlled.  Had some fatigue.  Noted to be on appropriate medications which were refilled.   [name] was just seen on 05/04/21    several weeks after being seen by Dr. Gerarda Gunther to transfer cardiology care.  She says that she has some anxiety about her heart attack and had multiple questions about existing disease etc.  She is accompanied by her husband who also has multiple questions.  They describe the incident that occurred leading to her heart attack.  They were down in [place], [place] camping in a camper.  They were loading up to drive home when she started having chest discomfort that would not go away.  They were able to find a local stand-alone ER.  Upon initial evaluation  there, she actually went to VT arrest requiring defibrillation.  She was then transferred to [facility] for PCI and did well after that.  Since discharge, she has had intermittent little "niggling pains" lasting a few seconds here and there but has also been had some spells lasting a few minutes not exertional.  She describes it more as a fluttering like sensation where her chest "shuts down".  These can happen several times a day or can not happen at all.  She actually has been quite active with exercise now walking back and forth to the mailbox (about 300 feet from the house).  She is not having any exertional symptoms of chest pain or pressure.  No dyspnea.  No PND, orthopnea or edema.  No syncope or near syncope, TIA or BX. No more true anginal type chest pain.  Recent Hospitalizations:  Zio Monitor results:  Overall pretty normal study.  No grossly abnormal findings.  Predominant rhythm Is Sinus Rhythm: Heart rate range 53 to 125 bpm with an average of 69 bpm.  Very rare PVCs, and rare PACs with some couplets and triplets.  1 atrial run 4 beats at a rate of 118 bpm. => This s episode was noted  Other than the quadruplet atrial run, symptoms noted with sinus rhythm as well as sinus rhythm with PACs or PVCs.  No sustained tachycardic arrhythmias: No atrial fibrillation, atrial flutter, supraventricular tachycardia, atrial tachycardia, ventricular tachycardia  No pauses or notable bradycardia  Reviewed  CV studies:    The following studies were reviewed today: (if available,  images/films reviewed: From Epic Chart or Care Everywhere)  Interval History:   Michelle Perez presents here today  REVIEWED OF SYSTEMS   Review of Systems  Constitutional:  Negative for malaise/fatigue and weight loss.  HENT:  Negative for nosebleeds.   Respiratory:  Negative for cough and shortness of breath.   Cardiovascular:        Per HPI  Gastrointestinal:  Negative for blood in stool and  melena.  Genitourinary:  Negative for hematuria.  Musculoskeletal:  Negative for joint pain and myalgias.  Neurological:  Positive for dizziness (Sometimes if she stands up quickly). Negative for focal weakness and weakness.  Psychiatric/Behavioral:  Negative for depression and memory loss. The patient is nervous/anxious. The patient does not have insomnia.    I have reviewed and (if needed) personally updated the patient's problem list, medications, allergies, past medical and surgical history, social and family history.   PAST MEDICAL HISTORY   Past Medical History:  Diagnosis Date   Arthritis    Cancer ([place])    skin cancer - nose   COPD (chronic obstructive pulmonary disease) ([place])    Coronary artery disease involving native coronary artery of native heart 03/29/2021   Non-STEMI with VT arrest: 99% OM1-DES PCI, ostial LCx 30 to 40%.  Left main 40 to 50% nonocclusive by IVUS (MLA 6.4 mm) nonobstructive LAD.   Diabetes mellitus without complication (HCC)    type 2 , diet and exercise controlled   GERD (gastroesophageal reflux disease)    H/O hiatal hernia    Hyperlipemia    Non-STEMI (non-ST elevated myocardial infarction) ([place]) 56/81/2751   Complicated by polymorphic V. tach -> occurred in the ER, defibrillated x1.:  99% OM1-PCI   [facility] (03/29/2021): Admitted with non-STEMI-outside hospital [place]--> shortly after initiation of evaluation, she suffered cardiac arrest (primary VT-defib x1, IV amiodarone).  Transferred to [facility], found to have 99% OM1 lesion with IVUS confirmed moderate left main and ostial LCx disease.  Preserved EF on echo:  Discharged on Aspirin & 6 6 EF of the DES lately Reported yes echo report Discharge on the echo report we will keep this to [place] intertrigo Plavix, Toprol 25 mg daily, rosuvastatin 10 mg daily.  Lasix 20 mg daily, Synthroid 25 mcg.  PAST SURGICAL HISTORY   Past Surgical History:   Procedure Laterality Date   ABDOMINAL HYSTERECTOMY  03/25/1982   APPENDECTOMY     CARDIOVASCULAR STRESS TEST  11/29/2011   poor image quality, no evidence of ischemia in well visualized segments, inferior lateral wall impossible to evaluate secondary to significant GI artifiact, preserved EF 56% [facility])   CORONARY STENT INTERVENTION  03/29/2021   [facility]: DES PCI-OM1 (full report not available)   LEFT HEART CATH AND CORONARY ANGIOGRAPHY  03/29/2021   [facility]: Normal aortic pressure.  40 to 50% LEFT MAIN (IVUS MLA 6.4 mm).  30 to 40% ostial LCx and 30% ostial LAD.  99% OM1-DES PCI.  30 to 40% ostial LCx also evaluated by IVUS.   REVERSE SHOULDER ARTHROPLASTY Right 04/01/2013   Procedure: REVERSE SHOULDER ARTHROPLASTY;  Surgeon: Nita Sells, MD;  Location: [place];  Service: Orthopedics;  Laterality: Right;   ROTATOR CUFF REPAIR Right    x3   SKIN CANCER EXCISION     nose and back   TONSILLECTOMY     and adenoids age 5   TRANSTHORACIC ECHOCARDIOGRAM     Normal EF 60 to  65%.  Hypokinesis of the entire inferolateral wall..  Diastolic function noted.  Mild MR with mild MAC.  AOV sclerosis but no stenosis.   LHC / PCI 03/29/21 1. Normal aortic pressure. 2.  40-50% left main: Evaluated with IVUS-nonobstructive left main coronary disease with borderline angiographic significance and a minimal luminal area of 6.4 sq mm.  3. Nonobstructive left anterior descending artery disease.  4. 99% OM1 stenosis with 30% to 40% ostial circumflex stenosis confirmed by intravascular ultrasound. =>  Successful percutaneous coronary intervention and drug-eluting stent implantation of OM1 with reduction of stenosis from 99% to 0%. Blood flow improved from TIMI 2 to TIMI 3.    1. Normal left ventricular cavity size. Normal left ventricular systolic function. LV Ejection Fraction is approximately: 62 %. 2. Left ventricular diastolic parameters are consistent with  normal diastolic function. 3. Resting Segmental Wall Motion Analysis: Total wall motion score is 1.12. There is hypokinesis of the entire inferolateral wall. The remaining left ventricular segments demonstrate normal wall motion. 4. Normal right ventricular size and systolic function. 5. The left atrium is normal in size. 6. Mild mitral valve regurgitation. Mild mitral annular calcification. 7. The aortic valve is sclerotic with adequate leaflet motion. 8. Normal estimated pulmonary artery systolic pressure. 9. The aortic root is normal in size. The ascending aorta is normal in size  There is no immunization history on file for this patient.  MEDICATIONS/ALLERGIES   Current Meds  Medication Sig   aspirin 81 MG EC tablet Take by mouth.   Bempedoic Acid (NEXLETOL) 180 MG TABS Take 1 tablet by mouth in the morning.   clopidogrel (PLAVIX) 75 MG tablet Take 1 tablet (75 mg total) by mouth daily.   Coenzyme Q10 (COQ-10) 100 MG CAPS Take 300 mg by mouth daily.   dexlansoprazole (DEXILANT) 60 MG capsule Take 1 capsule by mouth daily.   estrogens, conjugated, (PREMARIN) 0.3 MG tablet Take 0.3 mg by mouth daily. Take daily for 21 days then do not take for 7 days.   ezetimibe (ZETIA) 10 MG tablet Take 1 tablet (10 mg total) by mouth daily.   furosemide (LASIX) 20 MG tablet Take 20 mg by mouth daily.   levothyroxine (SYNTHROID) 25 MCG tablet Take 25 mcg by mouth every morning.   Multiple Vitamins-Minerals (CENTRUM SILVER 50+WOMEN) TABS Take by mouth.   nitroGLYCERIN (NITROSTAT) 0.4 MG SL tablet Place 1 tablet (0.4 mg total) under the tongue every 5 (five) minutes as needed for chest pain. Up to 3 times with each episode   potassium chloride (MICRO-K) 10 MEQ CR capsule Take 10 mEq by mouth daily.   rizatriptan (MAXALT-MLT) 10 MG disintegrating tablet Take by mouth.   rosuvastatin (CRESTOR) 10 MG tablet Take 0.5 tablets (5 mg total) by mouth 2 (two) times daily.   tapentadol (NUCYNTA) 100 MG 12 hr  tablet Take 100 mg by mouth every 12 (twelve) hours. Take 1 tablet by mouth twice a day.   tapentadol (NUCYNTA) 50 MG tablet Take 50 mg by mouth. Take 1 tablet by mouth daily as needed .    Allergies  Allergen Reactions   Codeine Hives    vicodin is okay   Tape Rash    SOCIAL HISTORY/FAMILY HISTORY   Reviewed in Epic:   Social History   Tobacco Use   Smoking status: Every Day    Packs/day: 0.60    Years: 43.00    Pack years: 25.80    Types: Cigarettes  Substance Use Topics   Alcohol  use: No   Drug use: No   Social History   Social History Narrative   Not on file   Family History  Problem Relation Age of Onset   Coronary artery disease Mother 16   Heart attack Mother 59   Heart attack Brother 70       Several heart attacks   Coronary artery disease Brother    Congestive Heart Failure Brother     OBJCTIVE -PE, EKG, labs   Wt Readings from Last 3 Encounters:  08/28/21 127 lb (57.6 kg)  06/26/21 124 lb (56.2 kg)  05/04/21 127 lb 3.2 oz (57.7 kg)    Physical Exam: BP 130/70   Pulse 68   Ht _0  (1.575 m)   Wt 127 lb (57.6 kg)   SpO2 97%   BMI 23.23 kg/m  Physical Exam Vitals reviewed.  Constitutional:      General: She is not in acute distress.    Appearance: Normal appearance. She is not ill-appearing or toxic-appearing.  HENT:     Head: Normocephalic and atraumatic.  Neck:     Vascular: No carotid bruit or JVD.  Cardiovascular:     Rate and Rhythm: Normal rate and regular rhythm. Occasional Extrasystoles are present.    Chest Wall: PMI is not displaced.     Pulses: Normal pulses.     Heart sounds: Normal heart sounds, S1 normal and S2 normal. No murmur heard.   No friction rub. No gallop.  Pulmonary:     Effort: Pulmonary effort is normal. No respiratory distress.     Breath sounds: Normal breath sounds. No wheezing, rhonchi or rales.  Chest:     Chest wall: No tenderness.  Abdominal:     General: Abdomen is flat. Bowel sounds are normal.  There is no distension.     Palpations: Abdomen is soft. There is no mass (No HSM or bruit).     Tenderness: There is no abdominal tenderness.  Musculoskeletal:        General: No swelling. Normal range of motion.     Cervical back: Normal range of motion.  Skin:    General: Skin is warm and dry.  Neurological:     General: No focal deficit present.     Mental Status: She is alert and oriented to person, place, and time.     Cranial Nerves: No cranial nerve deficit.     Gait: Gait normal.  Psychiatric:        Mood and Affect: Mood normal.        Behavior: Behavior normal.        Thought Content: Thought content normal.        Judgment: Judgment normal.     Adult ECG Report  Rate: 91;  Rhythm: normal sinus rhythm and normal axis, intervals and durations.  Left atrial enlargement  ;   Narrative Interpretation: Relatively normal EKG.  Recent Labs: Reviewed in [facility] 03/30/2021: TC 159, TG 119, HDL 50.6, LDL 84.6; AST 55 ALT 21, alk phos 71.  Cr 0.73, K+ 3.9     Latest Ref Rng & Units 06/26/2021    6:24 PM 04/02/2013    5:50 AM 03/26/2013    9:00 AM  CBC  WBC 4.0 - 10.5 K/uL 8.9   8.8   9.7    Hemoglobin 12.0 - 15.0 g/dL 14.2   10.7   14.3    Hematocrit 36.0 - 46.0 % 41.3   31.2   42.5  Platelets 150 - 400 K/uL 264   186   264      No results found for: HGBA1C No results found for: TSH  ==================================================  COVID-19 Education: The signs and symptoms of COVID-19 were discussed with the patient and how to seek care for testing (follow up with PCP or arrange E-visit).    I spent a total of 45 minutes with the patient spent in direct patient consultation.  -> Questions asked and answered.  Reviewed report and echo with the patient. Additional time spent with chart review  / charting (studies, outside notes, etc): 40 min -> H&P, discharge summary, Report, echo report and clinic notes reviewed.  Medical history updated. Total Time: 85  min  Current medicines are reviewed at length with the patient today.  (+/- concerns) n/a  This visit occurred during the SARS-CoV-2 public health emergency.  Safety protocols were in place, including screening questions prior to the visit, additional usage of staff PPE, and extensive cleaning of exam room while observing appropriate contact time as indicated for disinfecting solutions.  Notice: This dictation was prepared with Dragon dictation along with smart phrase technology. Any transcriptional errors that result from this process are unintentional and may not be corrected upon review.   Studies Ordered:  Orders Placed This Encounter  Procedures   Cardiac Stress Test: Informed Consent Details: Physician/Practitioner Attestation; Transcribe to consent form and obtain patient signature    Patient Instructions / Medication Changes & Studies & Tests Ordered   There are no Patient Instructions on file for this visit.    Glenetta Hew, M.D., M.S. Interventional Cardiologist   Pager # [phone] Phone # [phone] [address] [place], [place] [address]   Thank you for choosing Heartcare at [facility]!!

## 2021-08-29 ENCOUNTER — Encounter: Payer: Self-pay | Admitting: Cardiology

## 2021-08-29 NOTE — Assessment & Plan Note (Signed)
Blood pressure stable, upper limit of normal.  Should be easily tolerate additional 25 mg Toprol with me.  Hold off on any additional medications for now until we reassess after Myoview.

## 2021-08-29 NOTE — Assessment & Plan Note (Signed)
Exertional chest discomfort worse over the last month or 2.  She is concerned although the episodes are not completely similar to her anginal chest pain.  Increased dose of Toprol as noted  Myoview stress test mostly to evaluate stent patency but also potential anterior ischemia.  Left main disease.

## 2021-08-29 NOTE — Assessment & Plan Note (Signed)
In the setting of ACS likely aborted STEMI noted as non-STEMI.  Had 99% thrombotic occlusion of OM1.  PCI emergently. No further episodes.  She remains on stable dose of Toprol-with her having persistent palpitations despite nothing overly significant noted on monitor we will increase to Prilosec Toprol she will take 25 mg and 50 mg tablets split as twice daily (50 mg a.m., 25 mg p.m.

## 2021-08-29 NOTE — Assessment & Plan Note (Signed)
07/06/2021 labs look great.  Unfortunately she has had a cough Nexlizet.  We will give her 1 pathology to see if that helps remove the cough.  I suspect that there is other reasons for her to have a cough.  If the cough does not change she can go back on Nexlizet.  If not, she can continue to stay off we would have to figure out some other type of regimen.  She is probably not been able to tolerate higher dose of statin although we may need to try to titrate up to 20 mg rosuvastatin.

## 2021-08-29 NOTE — Assessment & Plan Note (Addendum)
DES PCI of the OM 1 - 2.5 x 16 Synergy stent, also Existing Moderate Left Main/Ostial LCx Disease.  She is now having concerning symptoms for possible recurrent anginal symptoms.  Not quite the same as the anginal type pain, but that was notably severe episode and she does not remember a lot about it.  Plan:  Continue Toprol but increase dosing to take an additional 25 mg in the evening (total 75 mg a day) along with low-dose furosemide  Continue current dose of rosuvastatin (although low threshold to increase if possible 20 mg), along with Zetia 10 mg.  Drive 36-PQA holiday off of Nexletol to see if the cough stops.  Continue DAPT with aspirin and Plavix  With recurrent chest pain symptoms notably exertional (unable to walk on TM 2/2 back pain) --> LEXISCAN MYOVIEW ST  Shared Decision Making/Informed Consent The risks [chest pain, shortness of breath, cardiac arrhythmias, dizziness, blood pressure fluctuations, myocardial infarction, stroke/transient ischemic attack, nausea, vomiting, allergic reaction, radiation exposure, metallic taste sensation and life-threatening complications (estimated to be 1 in 10,000)], benefits (risk stratification, diagnosing coronary artery disease, treatment guidance) and alternatives of a nuclear stress test were discussed in detail with Ms. Daniel and she agrees to proceed.

## 2021-09-03 ENCOUNTER — Telehealth (HOSPITAL_COMMUNITY): Payer: Self-pay | Admitting: *Deleted

## 2021-09-03 NOTE — Telephone Encounter (Signed)
Patient given detailed instructions per Myocardial Perfusion Study Information Sheet for the test on 09/10/2021 at 8:00. Patient notified to arrive 15 minutes early and that it is imperative to arrive on time for appointment to keep from having the test rescheduled.  If you need to cancel or reschedule your appointment, please call the office within 24 hours of your appointment. . Patient verbalized understanding.Michelle Perez

## 2021-09-10 ENCOUNTER — Ambulatory Visit (HOSPITAL_COMMUNITY): Payer: Medicare Other | Attending: Cardiovascular Disease

## 2021-09-10 DIAGNOSIS — I208 Other forms of angina pectoris: Secondary | ICD-10-CM | POA: Diagnosis not present

## 2021-09-10 DIAGNOSIS — I498 Other specified cardiac arrhythmias: Secondary | ICD-10-CM | POA: Diagnosis not present

## 2021-09-10 DIAGNOSIS — I2510 Atherosclerotic heart disease of native coronary artery without angina pectoris: Secondary | ICD-10-CM | POA: Diagnosis not present

## 2021-09-10 DIAGNOSIS — Z9861 Coronary angioplasty status: Secondary | ICD-10-CM | POA: Insufficient documentation

## 2021-09-10 LAB — MYOCARDIAL PERFUSION IMAGING
LV dias vol: 62 mL (ref 46–106)
LV sys vol: 18 mL
Nuc Stress EF: 71 %
Peak HR: 88 {beats}/min
Rest HR: 61 {beats}/min
Rest Nuclear Isotope Dose: 10.7 mCi
SDS: 3
SRS: 0
SSS: 3
ST Depression (mm): 0 mm
Stress Nuclear Isotope Dose: 32.2 mCi
TID: 1.08

## 2021-09-10 MED ORDER — TECHNETIUM TC 99M TETROFOSMIN IV KIT
32.2000 | PACK | Freq: Once | INTRAVENOUS | Status: AC | PRN
  Administered 2021-09-10: 32.2 via INTRAVENOUS

## 2021-09-10 MED ORDER — REGADENOSON 0.4 MG/5ML IV SOLN
0.4000 mg | Freq: Once | INTRAVENOUS | Status: AC
  Administered 2021-09-10: 0.4 mg via INTRAVENOUS

## 2021-09-10 MED ORDER — TECHNETIUM TC 99M TETROFOSMIN IV KIT
10.7000 | PACK | Freq: Once | INTRAVENOUS | Status: AC | PRN
  Administered 2021-09-10: 10.7 via INTRAVENOUS

## 2021-09-17 ENCOUNTER — Telehealth: Payer: Self-pay | Admitting: Cardiology

## 2021-09-17 NOTE — Telephone Encounter (Signed)
Patient called and want to know why they had to stop the test. Please call back to discuss

## 2021-09-19 ENCOUNTER — Telehealth: Payer: Self-pay | Admitting: Cardiology

## 2021-09-19 NOTE — Telephone Encounter (Signed)
Split dose -'25mg'$  AM & '50mg'$  PM-- tryfor~1-2 weeks   .Glenetta Hew, MD

## 2021-09-19 NOTE — Telephone Encounter (Signed)
Returned call to Pt.  She had a recent increase in her Toprol XL from 50 mg daily to 75 mg daily.  Since the increase she has a sensation like she is "going to pass out".  This seems to happen more frequently at bedtime.  She states she had this sensation before on the Toprol 50 mg daily, but it was not as "constant" as it is now.  She does not feel like the increased dose of Toprol has made a difference in her chest pain.  Her nighttime BP's are 104/42, 107/41  During the day her blood pressures are higher:  126/44, 153/69, 113/42.  Please advise.

## 2021-09-19 NOTE — Telephone Encounter (Signed)
Pt c/o BP issue: STAT if pt c/o blurred vision, one-sided weakness or slurred speech  1. What are your last 5 BP readings? 110/40   2. Are you having any other symptoms (ex. Dizziness, headache, blurred vision, passed out)? Dizziness and headache.  3. What is your BP issue? Pt states that the last two night she has felt like every time she lays down she feels like her bp bottoms out. She states that it feels like her heart just turns off or closes off.

## 2021-09-20 NOTE — Telephone Encounter (Signed)
Returned call to pt she states that she is already taking metoprolol '25mg'$  AM and 50pm. She says that she "told the nurse that" any other suggestions?

## 2021-09-23 NOTE — Telephone Encounter (Signed)
Just go back to 25 mg twice daily of Toprol.  Lets add Ranexa 500 mg twice daily for chest pain that may be microvascular.   Glenetta Hew, MD .

## 2021-09-24 MED ORDER — RANOLAZINE ER 500 MG PO TB12: 500.0000 mg | ORAL_TABLET | Freq: Two times a day (BID) | ORAL | 6 refills | Status: AC

## 2021-09-24 NOTE — Telephone Encounter (Signed)
Pt informed of providers recommendations. Pt verbalized understanding.  New rx sent in for ranexa. Pt was trying to tell me about some different symptoms and so I tried to schedule an appointment to discuss. Pt stated that she already has an appointment and hung up.

## 2021-10-19 ENCOUNTER — Ambulatory Visit: Payer: Medicare Other | Admitting: Adult Health

## 2022-02-27 ENCOUNTER — Other Ambulatory Visit: Payer: Self-pay | Admitting: Cardiology

## 2022-04-27 ENCOUNTER — Other Ambulatory Visit: Payer: Self-pay | Admitting: Cardiology

## 2022-06-24 ENCOUNTER — Other Ambulatory Visit: Payer: Self-pay | Admitting: Cardiology

## 2022-07-22 ENCOUNTER — Other Ambulatory Visit: Payer: Self-pay | Admitting: Cardiology

## 2022-09-20 ENCOUNTER — Other Ambulatory Visit: Payer: Self-pay | Admitting: Cardiology
